# Patient Record
Sex: Female | Born: 1980 | Race: White | Hispanic: No | Marital: Married | Smoking: Never smoker
Health system: Southern US, Community
[De-identification: ages and names within clinical notes are randomized; demographics above are authoritative.]

## PROBLEM LIST (undated history)

## (undated) DIAGNOSIS — Z87442 Personal history of urinary calculi: Secondary | ICD-10-CM

## (undated) DIAGNOSIS — K219 Gastro-esophageal reflux disease without esophagitis: Secondary | ICD-10-CM

## (undated) DIAGNOSIS — J45909 Unspecified asthma, uncomplicated: Secondary | ICD-10-CM

## (undated) DIAGNOSIS — E063 Autoimmune thyroiditis: Secondary | ICD-10-CM

## (undated) HISTORY — DX: Gastro-esophageal reflux disease without esophagitis: K21.9

## (undated) HISTORY — DX: Autoimmune thyroiditis: E06.3

## (undated) HISTORY — PX: UPPER GASTROINTESTINAL ENDOSCOPY: SHX188

## (undated) HISTORY — DX: Unspecified asthma, uncomplicated: J45.909

## (undated) HISTORY — PX: ANKLE SURGERY: SHX546

## (undated) HISTORY — DX: Personal history of urinary calculi: Z87.442

## (undated) HISTORY — PX: KIDNEY STONE SURGERY: SHX686

## (undated) HISTORY — PX: CARPAL TUNNEL WITH CUBITAL TUNNEL: SHX5608

---

## 2017-12-10 LAB — HM PAP SMEAR: HM PAP: NEGATIVE

## 2018-10-30 ENCOUNTER — Ambulatory Visit (INDEPENDENT_AMBULATORY_CARE_PROVIDER_SITE_OTHER): Payer: No Typology Code available for payment source | Admitting: Physician Assistant

## 2018-10-30 ENCOUNTER — Encounter: Payer: Self-pay | Admitting: Physician Assistant

## 2018-10-30 VITALS — BP 121/79 | HR 84 | Resp 16 | Ht 61.0 in | Wt 173.0 lb

## 2018-10-30 DIAGNOSIS — Z87442 Personal history of urinary calculi: Secondary | ICD-10-CM

## 2018-10-30 DIAGNOSIS — J4520 Mild intermittent asthma, uncomplicated: Secondary | ICD-10-CM

## 2018-10-30 DIAGNOSIS — Z13220 Encounter for screening for lipoid disorders: Secondary | ICD-10-CM

## 2018-10-30 DIAGNOSIS — G5602 Carpal tunnel syndrome, left upper limb: Secondary | ICD-10-CM | POA: Insufficient documentation

## 2018-10-30 DIAGNOSIS — Z8349 Family history of other endocrine, nutritional and metabolic diseases: Secondary | ICD-10-CM | POA: Insufficient documentation

## 2018-10-30 DIAGNOSIS — Z7689 Persons encountering health services in other specified circumstances: Secondary | ICD-10-CM | POA: Diagnosis not present

## 2018-10-30 DIAGNOSIS — Z1329 Encounter for screening for other suspected endocrine disorder: Secondary | ICD-10-CM

## 2018-10-30 DIAGNOSIS — J310 Chronic rhinitis: Secondary | ICD-10-CM | POA: Insufficient documentation

## 2018-10-30 DIAGNOSIS — J0110 Acute frontal sinusitis, unspecified: Secondary | ICD-10-CM

## 2018-10-30 DIAGNOSIS — J342 Deviated nasal septum: Secondary | ICD-10-CM | POA: Insufficient documentation

## 2018-10-30 DIAGNOSIS — Z3169 Encounter for other general counseling and advice on procreation: Secondary | ICD-10-CM

## 2018-10-30 DIAGNOSIS — Z130 Encounter for screening for diseases of the blood and blood-forming organs and certain disorders involving the immune mechanism: Secondary | ICD-10-CM

## 2018-10-30 MED ORDER — IPRATROPIUM BROMIDE 0.06 % NA SOLN: 2.0000 | Freq: Four times a day (QID) | NASAL | 5 refills | Status: DC | PRN

## 2018-10-30 MED ORDER — PRENATAL ONE DAILY 27-0.8 MG PO TABS: 1.0000 | ORAL_TABLET | Freq: Every day | ORAL | 3 refills | Status: AC

## 2018-10-30 MED ORDER — AMOXICILLIN-POT CLAVULANATE 875-125 MG PO TABS: 1.0000 | ORAL_TABLET | Freq: Two times a day (BID) | ORAL | 0 refills | Status: AC

## 2018-10-30 NOTE — Progress Notes (Signed)
HPI:                                                                Anita Bryant is a 38 y.o. female who presents to [facility]: [place] today to establish care  Recently relocated from [facility], [place]  Current concerns:  Planning to achieve pregnancy within the next year. Husband has low sperm count and low motility. Recently referred to a new fertility specialist.  Endorses chronic PND, throat clearing/cough, and sinus pressure. States she has had purulent, green nasal drainage for several weeks. No fever, chills, malaise. Hx of deviated nasal septum. She states she was evaluated by ENT and had a CT sinus, but does not know the result. She has tried Flonase without relief. Not currently on any treatment. No recent antibiotic use.  Asthma: reports it is non-allergic. Triggered by perfumes and chemicals mostly. Uses Albuterol prn. No exacerbations in the last year.   Depression screen [facility] 2/9 10/30/2018  Decreased Interest 0  Down, Depressed, Hopeless 0  PHQ - 2 Score 0    No flowsheet data found.    Past Medical History:  Diagnosis Date  . Asthma   . History of nephrolithiasis    Past Surgical History:  Procedure Laterality Date  . CARPAL TUNNEL WITH CUBITAL TUNNEL    . KIDNEY STONE SURGERY     Social History   Tobacco Use  . Smoking status: Never Smoker  . Smokeless tobacco: Never Used  Substance Use Topics  . Alcohol use: Never    Frequency: Never   family history includes Thyroid disease in her mother.    ROS: negative except as noted in the HPI  Medications: Current Outpatient Medications  Medication Sig Dispense Refill  . albuterol (PROVENTIL HFA;VENTOLIN HFA) 108 (90 Base) MCG/ACT inhaler Inhale 1-2 puffs into the lungs every 6 (six) hours as needed.    Marland Kitchen amoxicillin-clavulanate (AUGMENTIN) 875-125 MG tablet Take 1 tablet by mouth 2 (two) times daily for 14 days. 28 tablet 0  . ipratropium (ATROVENT) 0.06 % nasal  spray Place 2 sprays into both nostrils 4 (four) times daily as needed for rhinitis. 15 mL 5  . Prenatal Vit-Fe Fumarate-FA (PRENATAL ONE DAILY) 27-0.8 MG TABS Take 1 tablet by mouth daily. 90 tablet 3   No current facility-administered medications for this visit.    No Known Allergies     Objective:  BP 121/79   Pulse 84   Resp 16   Ht 5\' 1"  (1.549 m)   Wt 173 lb (78.5 kg)   LMP 10/11/2018 (Exact Date)   BMI 32.69 kg/m  Gen:  alert, not ill-appearing, no distress, appropriate for age, obese female HEENT: head normocephalic without obvious abnormality, conjunctiva and cornea clear, wearing glasses, rightward deviation of nasal septum, nasal mucosa edematous, there is frontal sinus tenderness, oropharynx clear, uvula midline, neck supple, no cervical adenopathy,  trachea midline Pulm: Normal work of breathing, normal phonation, clear to auscultation bilaterally, no wheezes, rales or rhonchi CV: Normal rate, regular rhythm, s1 and s2 distinct, no murmurs, clicks or rubs  Neuro: alert and oriented x 3, no tremor MSK: extremities atraumatic, normal gait and station Skin: intact, no rashes on exposed skin, no jaundice, no cyanosis Psych: well-groomed,  cooperative, good eye contact, euthymic mood, affect mood-congruent, speech is articulate, and thought processes clear and goal-directed    No results found for this or any previous visit (from the past 72 hour(s)). No results found.    Assessment and Plan: 38 y.o. female with   .[name] was seen today for establish care.  Diagnoses and all orders for this visit:  Encounter to establish care  History of nephrolithiasis  Deviated nasal septum  Chronic rhinitis -     ipratropium (ATROVENT) 0.06 % nasal spray; Place 2 sprays into both nostrils 4 (four) times daily as needed for rhinitis.  Mild intermittent asthma without complication  Carpal tunnel syndrome on left  Family history of thyroid disease in mother -     Thyroid  Panel With TSH  Screening for blood disease -     COMPLETE METABOLIC PANEL WITH GFR -     CBC  Screening for lipid disorders -     Lipid Panel w/reflex Direct LDL  Screening for thyroid disorder -     Thyroid Panel With TSH  Subacute frontal sinusitis -     amoxicillin-clavulanate (AUGMENTIN) 875-125 MG tablet; Take 1 tablet by mouth 2 (two) times daily for 14 days.  Encounter for preconception consultation -     Prenatal Vit-Fe Fumarate-FA (PRENATAL ONE DAILY) 27-0.8 MG TABS; Take 1 tablet by mouth daily.    - Personally reviewed PMH, PSH, PFH, medications, allergies, HM - Age-appropriate cancer screening: Pap UTD per patient, requesting record from "[facility]" - Influenza declined - Tdap UTD - PHQ2 negative    Patient education and anticipatory guidance given Patient agrees with treatment plan Follow-up as needed if symptoms worsen or fail to improve  Darlyne Russian PA-C

## 2018-11-01 ENCOUNTER — Other Ambulatory Visit: Payer: Self-pay | Admitting: Physician Assistant

## 2018-11-01 DIAGNOSIS — R7989 Other specified abnormal findings of blood chemistry: Secondary | ICD-10-CM

## 2018-11-01 LAB — CBC
HCT: 40.1 % (ref 35.0–45.0)
Hemoglobin: 13.2 g/dL (ref 11.7–15.5)
MCH: 27.5 pg (ref 27.0–33.0)
MCHC: 32.9 g/dL (ref 32.0–36.0)
MCV: 83.5 fL (ref 80.0–100.0)
MPV: 9.7 fL (ref 7.5–12.5)
Platelets: 286 10*3/uL (ref 140–400)
RBC: 4.8 10*6/uL (ref 3.80–5.10)
RDW: 13 % (ref 11.0–15.0)
WBC: 6.9 10*3/uL (ref 3.8–10.8)

## 2018-11-01 LAB — COMPLETE METABOLIC PANEL WITH GFR
AG Ratio: 1.8 (calc) (ref 1.0–2.5)
ALT: 14 U/L (ref 6–29)
AST: 13 U/L (ref 10–30)
Albumin: 4.5 g/dL (ref 3.6–5.1)
Alkaline phosphatase (APISO): 73 U/L (ref 31–125)
BUN: 14 mg/dL (ref 7–25)
CO2: 27 mmol/L (ref 20–32)
Calcium: 9.3 mg/dL (ref 8.6–10.2)
Chloride: 105 mmol/L (ref 98–110)
Creat: 0.8 mg/dL (ref 0.50–1.10)
GFR, Est African American: 110 mL/min/{1.73_m2} (ref >=60)
GFR, Est Non African American: 95 mL/min/{1.73_m2} (ref >=60)
GLUCOSE: 89 mg/dL (ref 65–99)
Globulin: 2.5 g/dL (calc) (ref 1.9–3.7)
Potassium: 4 mmol/L (ref 3.5–5.3)
Sodium: 138 mmol/L (ref 135–146)
Total Bilirubin: 0.5 mg/dL (ref 0.2–1.2)
Total Protein: 7 g/dL (ref 6.1–8.1)

## 2018-11-01 LAB — THYROID PANEL WITH TSH
Free Thyroxine Index: 2.4 (ref 1.4–3.8)
T3 Uptake: 32 % (ref 22–35)
T4, Total: 7.5 ug/dL (ref 5.1–11.9)
TSH: 4.73 mIU/L — ABNORMAL HIGH

## 2018-11-01 LAB — LIPID PANEL W/REFLEX DIRECT LDL
Cholesterol: 183 mg/dL (ref <200)
HDL: 49 mg/dL — AB (ref >=50)
LDL CHOLESTEROL (CALC): 105 mg/dL — AB
Non-HDL Cholesterol (Calc): 134 mg/dL (calc) — ABNORMAL HIGH (ref <130)
Total CHOL/HDL Ratio: 3.7 (calc) (ref <5.0)
Triglycerides: 169 mg/dL — ABNORMAL HIGH (ref <150)

## 2018-11-01 NOTE — Progress Notes (Signed)
Per labs:  "Would recommend we recheck your thyroid function in 3 months"  Ordered and faxed to labs for 3 months

## 2018-11-04 ENCOUNTER — Telehealth: Payer: Self-pay | Admitting: Physician Assistant

## 2018-11-04 NOTE — Telephone Encounter (Signed)
Let patient know I had a chance to review her CT sinus There is no evidence of sinusitis on this scan There is significant swelling of the nasal turbinates suggesting chronic rhinitis. Definitely recommend treating this with an intranasal steroid. There are multiple options, so if she did not tolerate Flonase she could try Nasonex  She can pick-up the disc of her images from our office (will leave in my basket)

## 2018-11-04 NOTE — Telephone Encounter (Signed)
Pt notified of recommendations -EH/RMA  

## 2018-11-07 DIAGNOSIS — J45901 Unspecified asthma with (acute) exacerbation: Secondary | ICD-10-CM | POA: Insufficient documentation

## 2018-11-07 DIAGNOSIS — J4520 Mild intermittent asthma, uncomplicated: Secondary | ICD-10-CM | POA: Insufficient documentation

## 2018-11-14 ENCOUNTER — Encounter: Payer: Self-pay | Admitting: Physician Assistant

## 2018-11-16 ENCOUNTER — Telehealth: Payer: No Typology Code available for payment source | Admitting: Nurse Practitioner

## 2018-11-16 DIAGNOSIS — M545 Low back pain, unspecified: Secondary | ICD-10-CM

## 2018-11-16 MED ORDER — NAPROXEN 500 MG PO TABS: 500.0000 mg | ORAL_TABLET | Freq: Two times a day (BID) | ORAL | 1 refills | Status: DC

## 2018-11-16 MED ORDER — CYCLOBENZAPRINE HCL 10 MG PO TABS: 10.0000 mg | ORAL_TABLET | Freq: Three times a day (TID) | ORAL | 1 refills | Status: DC | PRN

## 2018-11-16 NOTE — Progress Notes (Signed)

## 2018-12-18 ENCOUNTER — Ambulatory Visit: Payer: No Typology Code available for payment source | Admitting: Physician Assistant

## 2018-12-18 ENCOUNTER — Encounter: Payer: Self-pay | Admitting: Physician Assistant

## 2018-12-18 ENCOUNTER — Ambulatory Visit (HOSPITAL_BASED_OUTPATIENT_CLINIC_OR_DEPARTMENT_OTHER)
Admission: RE | Admit: 2018-12-18 | Discharge: 2018-12-18 | Disposition: A | Discharge status: Home / Self Care | Payer: No Typology Code available for payment source | Source: Ambulatory Visit | Attending: Physician Assistant | Admitting: Physician Assistant

## 2018-12-18 ENCOUNTER — Encounter (HOSPITAL_BASED_OUTPATIENT_CLINIC_OR_DEPARTMENT_OTHER): Payer: Self-pay

## 2018-12-18 ENCOUNTER — Other Ambulatory Visit: Payer: Self-pay

## 2018-12-18 ENCOUNTER — Other Ambulatory Visit: Payer: Self-pay | Admitting: Physician Assistant

## 2018-12-18 VITALS — BP 132/86 | HR 83 | Temp 98.8°F | Resp 16 | Wt 172.0 lb

## 2018-12-18 DIAGNOSIS — R1031 Right lower quadrant pain: Secondary | ICD-10-CM

## 2018-12-18 DIAGNOSIS — R1013 Epigastric pain: Secondary | ICD-10-CM | POA: Diagnosis not present

## 2018-12-18 DIAGNOSIS — R1084 Generalized abdominal pain: Secondary | ICD-10-CM | POA: Diagnosis not present

## 2018-12-18 DIAGNOSIS — R110 Nausea: Secondary | ICD-10-CM

## 2018-12-18 DIAGNOSIS — R7989 Other specified abnormal findings of blood chemistry: Secondary | ICD-10-CM

## 2018-12-18 DIAGNOSIS — R1011 Right upper quadrant pain: Secondary | ICD-10-CM

## 2018-12-18 DIAGNOSIS — R198 Other specified symptoms and signs involving the digestive system and abdomen: Secondary | ICD-10-CM

## 2018-12-18 LAB — LIPASE: Lipase: 19 U/L (ref 7–60)

## 2018-12-18 LAB — COMPLETE METABOLIC PANEL WITH GFR
AG RATIO: 1.7 (calc) (ref 1.0–2.5)
ALT: 30 U/L — ABNORMAL HIGH (ref 6–29)
AST: 21 U/L (ref 10–30)
Albumin: 4.5 g/dL (ref 3.6–5.1)
Alkaline phosphatase (APISO): 90 U/L (ref 31–125)
BILIRUBIN TOTAL: 0.3 mg/dL (ref 0.2–1.2)
BUN: 14 mg/dL (ref 7–25)
CHLORIDE: 105 mmol/L (ref 98–110)
CO2: 25 mmol/L (ref 20–32)
Calcium: 9.4 mg/dL (ref 8.6–10.2)
Creat: 0.66 mg/dL (ref 0.50–1.10)
GFR, Est African American: 132 mL/min/{1.73_m2} (ref >=60)
GFR, Est Non African American: 114 mL/min/{1.73_m2} (ref >=60)
GLUCOSE: 106 mg/dL — AB (ref 65–99)
Globulin: 2.7 g/dL (calc) (ref 1.9–3.7)
Potassium: 3.6 mmol/L (ref 3.5–5.3)
Sodium: 139 mmol/L (ref 135–146)
Total Protein: 7.2 g/dL (ref 6.1–8.1)

## 2018-12-18 LAB — CBC WITH DIFFERENTIAL/PLATELET
Absolute Monocytes: 499 cells/uL (ref 200–950)
Basophils Absolute: 47 cells/uL (ref 0–200)
Basophils Relative: 0.6 %
Eosinophils Absolute: 296 cells/uL (ref 15–500)
Eosinophils Relative: 3.8 %
HCT: 36.4 % (ref 35.0–45.0)
Hemoglobin: 12.5 g/dL (ref 11.7–15.5)
Lymphs Abs: 1521 cells/uL (ref 850–3900)
MCH: 28.1 pg (ref 27.0–33.0)
MCHC: 34.3 g/dL (ref 32.0–36.0)
MCV: 81.8 fL (ref 80.0–100.0)
MONOS PCT: 6.4 %
MPV: 9.3 fL (ref 7.5–12.5)
Neutro Abs: 5437 cells/uL (ref 1500–7800)
Neutrophils Relative %: 69.7 %
Platelets: 387 10*3/uL (ref 140–400)
RBC: 4.45 10*6/uL (ref 3.80–5.10)
RDW: 12.8 % (ref 11.0–15.0)
Total Lymphocyte: 19.5 %
WBC: 7.8 10*3/uL (ref 3.8–10.8)

## 2018-12-18 LAB — POCT URINALYSIS DIPSTICK
Bilirubin, UA: NEGATIVE
GLUCOSE UA: NEGATIVE
Ketones, UA: NEGATIVE
Nitrite, UA: NEGATIVE
Protein, UA: NEGATIVE
Spec Grav, UA: 1.02 (ref 1.010–1.025)
Urobilinogen, UA: 0.2 E.U./dL
pH, UA: 6 (ref 5.0–8.0)

## 2018-12-18 LAB — POCT URINE PREGNANCY: Preg Test, Ur: NEGATIVE

## 2018-12-18 MED ORDER — IOHEXOL 300 MG/ML  SOLN
100.0000 mL | Freq: Once | INTRAMUSCULAR | Status: AC | PRN
  Administered 2018-12-18: 100 mL via INTRAVENOUS

## 2018-12-18 MED ORDER — PANTOPRAZOLE SODIUM 40 MG PO TBEC: 40.0000 mg | DELAYED_RELEASE_TABLET | Freq: Every day | ORAL | 0 refills | Status: DC

## 2018-12-18 MED ORDER — ONDANSETRON HCL 4 MG PO TABS
8.0000 mg | ORAL_TABLET | Freq: Once | ORAL | Status: AC
  Administered 2018-12-18: 8 mg via ORAL

## 2018-12-18 NOTE — Telephone Encounter (Signed)
OK for phone/web visit or do you want pt to be evaluated in office?  Please advise

## 2018-12-18 NOTE — Progress Notes (Signed)
HPI:                                                                Anita Bryant is a 38 y.o. female who presents to [place]: Anita Bryant today for abdominal pain  Onset - 2 weeks ago, gradual Location - Epigastric and RUQ pain Pain is waxing and waning, radiates deep into her back Symptoms are worse at night and worse with laying on her right side Associated with belching, nausea, decreased appetite, and flushing Denies melena or hematochezia; reports she had several days of loose stools, last BM this morning and normal No vomiting or regurgitation Has tried TUMS and Pepcid, which only provides temporary relief for less than an hour Denies regular NSAID use  She has a hx of renal stones but denies symptoms of renal colic. Denies hematuria, dysuria, urgency/frequency   Past Medical History:  Diagnosis Date  . Asthma   . History of nephrolithiasis    Past Surgical History:  Procedure Laterality Date  . CARPAL TUNNEL WITH CUBITAL TUNNEL    . KIDNEY STONE SURGERY     Social History   Tobacco Use  . Smoking status: Never Smoker  . Smokeless tobacco: Never Used  Substance Use Topics  . Alcohol use: Never    Frequency: Never   family history includes Thyroid disease in her mother.    ROS: negative except as noted in the HPI  Medications: Current Outpatient Medications  Medication Sig Dispense Refill  . albuterol (PROVENTIL HFA;VENTOLIN HFA) 108 (90 Base) MCG/ACT inhaler Inhale 1-2 puffs into the lungs every 6 (six) hours as needed.    Marland Kitchen ipratropium (ATROVENT) 0.06 % nasal spray Place 2 sprays into both nostrils 4 (four) times daily as needed for rhinitis. 15 mL 5  . pantoprazole (PROTONIX) 40 MG tablet Take 1 tablet (40 mg total) by mouth daily. 30 tablet 0  . Prenatal Vit-Fe Fumarate-FA (PRENATAL ONE DAILY) 27-0.8 MG TABS Take 1 tablet by mouth daily. 90 tablet 3   No current facility-administered medications for this visit.     No Known Allergies   Objective:  BP 132/86   Pulse 83   Temp 98.8 F (37.1 C) (Oral)   Resp 16   Wt 172 lb (78 kg)   LMP 12/05/2018 (Exact Date)   SpO2 99%   BMI 32.50 kg/m  Gen:  alert, not ill-appearing, no distress, appropriate for age 60: head normocephalic without obvious abnormality, conjunctiva and cornea clear, trachea midline Pulm: Normal work of breathing, normal phonation, clear to auscultation bilaterally, no wheezes, rales or rhonchi CV: Normal rate, regular rhythm, s1 and s2 distinct, no murmurs, clicks or rubs  GI: abdomen soft, there is epigastric and RLQ pain with guarding and rebound tenderness Neuro: alert and oriented x 3, no tremor MSK: extremities atraumatic, normal gait and station Skin: intact, no rashes on exposed skin, no jaundice, no cyanosis   Ref Range & Units 4d ago  Color, UA  yellow   Clarity, UA  clear   Glucose, UA Negative Negative   Bilirubin, UA  negative   Ketones, UA  negative   Spec Grav, UA 1.010 - 1.025 1.020   Blood, UA  moderate   pH, UA 5.0 - 8.0  6.0   Protein, UA Negative Negative   Urobilinogen, UA 0.2 or 1.0 E.U./dL 0.2   Nitrite, UA  negative   Leukocytes, UA Negative TraceAbnormal    Appearance    Odor    Resulting Agency  [facility]      Specimen Collected: 12/18/18 15:32  Last Resulted: 12/18/18 15:32         Assessment and Plan: 38 y.o. female with   .Anita Bryant was seen today for abdominal pain.  Diagnoses and all orders for this visit:  Acute epigastric pain -     pantoprazole (PROTONIX) 40 MG tablet; Take 1 tablet (40 mg total) by mouth daily. -     ondansetron (ZOFRAN) tablet 8 mg  Generalized abdominal pain -     CBC with Differential/Platelet -     COMPLETE METABOLIC PANEL WITH GFR -     Lipase -     POCT Urinalysis Dipstick -     Urine Culture -     POCT urine pregnancy -     ondansetron (ZOFRAN) tablet 8 mg  Nausea without vomiting -     Lipase -     ondansetron (ZOFRAN) tablet 8 mg  Right  lower quadrant abdominal pain -     CT ABDOMEN PELVIS W CONTRAST -     POCT Urinalysis Dipstick -     Urine Culture -     POCT urine pregnancy  Abdominal involuntary guarding  Increased thyroid stimulating hormone (TSH) level -     Thyroid Panel With TSH   Afebrile, no tachycardia, no tachypnea, mildly hypertensive, abdomen tender with guarding and rebound of RLQ UA positive for trace leuks and moderate blood, urine cx pending Urine hcg negative CT abdomen/pelvis pending CBC w/diff, CMP, lipase pending If CT is negative, recommend starting Protonix 40 mg and GERD diet  Patient education and anticipatory guidance given Patient agrees with treatment plan Follow-up as needed if symptoms worsen or fail to improve  Darlyne Russian PA-C

## 2018-12-18 NOTE — Patient Instructions (Addendum)
Plan Go downstairs to imaging. Pick-up oral contrast drink. Follow instructions for drinking Go to the lab for blood draw Go home to drink Go to [facility] for CT scan (they will call you with an appointment time) If CT scan is normal, I want you to start Pantoprazole 40 mg daily and follow a GERD diet I will order an ultrasound to assess your gallbladder tomorrow (if needed)   Food Choices for Gastroesophageal Reflux Disease, Adult When you have gastroesophageal reflux disease (GERD), the foods you eat and your eating habits are very important. Choosing the right foods can help ease the discomfort of GERD. Consider working with a diet and nutrition specialist (dietitian) to help you make healthy food choices. What general guidelines should I follow?  Eating plan  Choose healthy foods low in fat, such as fruits, vegetables, whole grains, low-fat dairy products, and lean meat, fish, and poultry.  Eat frequent, small meals instead of three large meals each day. Eat your meals slowly, in a relaxed setting. Avoid bending over or lying down until 2-3 hours after eating.  Limit high-fat foods such as fatty meats or fried foods.  Limit your intake of oils, butter, and shortening to less than 8 teaspoons each day.  Avoid the following: ? Foods that cause symptoms. These may be different for different people. Keep a food diary to keep track of foods that cause symptoms. ? Alcohol. ? Drinking large amounts of liquid with meals. ? Eating meals during the 2-3 hours before bed.  Cook foods using methods other than frying. This may include baking, grilling, or broiling. Lifestyle  Maintain a healthy weight. Ask your health care provider what weight is healthy for you. If you need to lose weight, work with your health care provider to do so safely.  Exercise for at least 30 minutes on 5 or more days each week, or as told by your health care provider.  Avoid wearing clothes that  fit tightly around your waist and chest.  Do not use any products that contain nicotine or tobacco, such as cigarettes and e-cigarettes. If you need help quitting, ask your health care provider.  Sleep with the head of your bed raised. Use a wedge under the mattress or blocks under the bed frame to raise the head of the bed. What foods are not recommended? The items listed may not be a complete list. Talk with your dietitian about what dietary choices are best for you. Grains Pastries or quick breads with added fat. [place] toast. Vegetables Deep fried vegetables. [place] fries. Any vegetables prepared with added fat. Any vegetables that cause symptoms. For some people this may include tomatoes and tomato products, chili peppers, onions and garlic, and horseradish. Fruits Any fruits prepared with added fat. Any fruits that cause symptoms. For some people this may include citrus fruits, such as oranges, grapefruit, pineapple, and lemons. Meats and other protein foods High-fat meats, such as fatty beef or pork, hot dogs, ribs, ham, sausage, salami and bacon. Fried meat or protein, including fried fish and fried chicken. Nuts and nut butters. Dairy Whole milk and chocolate milk. Sour cream. Cream. Ice cream. Cream cheese. Milk shakes. Beverages Coffee and tea, with or without caffeine. Carbonated beverages. Sodas. Energy drinks. Fruit juice made with acidic fruits (such as orange or grapefruit). Tomato juice. Alcoholic drinks. Fats and oils Butter. Margarine. Shortening. Ghee. Sweets and desserts Chocolate and cocoa. Donuts. Seasoning and other foods Pepper. Peppermint and spearmint. Any condiments, herbs, or  seasonings that cause symptoms. For some people, this may include curry, hot sauce, or vinegar-based salad dressings. Summary  When you have gastroesophageal reflux disease (GERD), food and lifestyle choices are very important to help ease the discomfort of GERD.  Eat frequent, small  meals instead of three large meals each day. Eat your meals slowly, in a relaxed setting. Avoid bending over or lying down until 2-3 hours after eating.  Limit high-fat foods such as fatty meat or fried foods. This information is not intended to replace advice given to you by your health care provider. Make sure you discuss any questions you have with your health care provider. Document Released: 09/11/2005 Document Revised: 09/12/2016 Document Reviewed: 09/12/2016 Elsevier Interactive Patient Education  2019 [employer].

## 2018-12-19 ENCOUNTER — Ambulatory Visit (INDEPENDENT_AMBULATORY_CARE_PROVIDER_SITE_OTHER): Payer: No Typology Code available for payment source

## 2018-12-19 DIAGNOSIS — R1011 Right upper quadrant pain: Secondary | ICD-10-CM | POA: Diagnosis not present

## 2018-12-19 LAB — THYROID PANEL WITH TSH
Free Thyroxine Index: 2.6 (ref 1.4–3.8)
T3 Uptake: 29 % (ref 22–35)
T4, Total: 8.8 ug/dL (ref 5.1–11.9)
TSH: 3.6 m[IU]/L

## 2018-12-19 LAB — URINE CULTURE
MICRO NUMBER:: 352632
SPECIMEN QUALITY:: ADEQUATE

## 2018-12-20 ENCOUNTER — Other Ambulatory Visit: Payer: Self-pay | Admitting: Physician Assistant

## 2018-12-20 DIAGNOSIS — K921 Melena: Secondary | ICD-10-CM

## 2018-12-20 DIAGNOSIS — R1013 Epigastric pain: Secondary | ICD-10-CM

## 2018-12-22 ENCOUNTER — Encounter: Payer: Self-pay | Admitting: Physician Assistant

## 2018-12-22 DIAGNOSIS — R7989 Other specified abnormal findings of blood chemistry: Secondary | ICD-10-CM | POA: Insufficient documentation

## 2018-12-24 ENCOUNTER — Other Ambulatory Visit: Payer: Self-pay

## 2018-12-24 ENCOUNTER — Telehealth: Payer: No Typology Code available for payment source | Admitting: Gastroenterology

## 2018-12-24 ENCOUNTER — Encounter: Payer: Self-pay | Admitting: Gastroenterology

## 2018-12-24 DIAGNOSIS — K760 Fatty (change of) liver, not elsewhere classified: Secondary | ICD-10-CM

## 2018-12-24 DIAGNOSIS — K921 Melena: Secondary | ICD-10-CM

## 2018-12-24 DIAGNOSIS — K7689 Other specified diseases of liver: Secondary | ICD-10-CM

## 2018-12-24 DIAGNOSIS — K602 Anal fissure, unspecified: Secondary | ICD-10-CM

## 2018-12-24 DIAGNOSIS — R1013 Epigastric pain: Secondary | ICD-10-CM

## 2018-12-24 MED ORDER — AMBULATORY NON FORMULARY MEDICATION: 1.0000 [in_us] | Freq: Two times a day (BID) | 2 refills | Status: AC

## 2018-12-24 NOTE — Patient Instructions (Addendum)
We have sent the following medications to your pharmacy for you to pick up at your convenience: [address] [place] [address] Phone: [phone]  Medication Name: Nitroglycerin Ointment 0.125%  Place 1 inch rectally as directed.  A pea-sized drop should be placed on the tip of your finger and then gently placed inside the anus. The finger should be inserted 1/3 - 1/2 its length and may be covered with a plastic glove or finger cot. You may use Vaseline to help coat the finger or dilute the ointment. The first few applications should be taken lying down, as mild light-headedness or a brief headache may occur.  It was a pleasure to see you today!  Vito Cirigliano, D.O.

## 2018-12-24 NOTE — Progress Notes (Signed)
Chief Complaint: Abdominal pain, dyschezia  Referring Provider:     Trixie Dredge, PA-C   HPI:    Due to current restrictions/limitations of in-office visits due to the COVID-19 pandemic, this scheduled clinical appointment was converted to a telehealth virtual consultation using WebEx/Zoom.  -Time of medical discussion: 22 minutes -The patient did consent to this virtual visit and is aware of possible charges through their insurance for this visit.  -Names of all parties present: Anita Bryant (patient), Gerrit Heck, DO, [facility] (physician) -Patient location: Home -Physician location: Office  Anita Bryant is a 38 y.o. female referred to [facility] for evaluation of upper abdominal/RUQ pain. Pain started approx 2 weeks ago, waking from sleep. Radiating to mid back. No prior similar sxs. Nausea with pain, but no emesis. Pain will last multiple hours. Can occur intermittently through the day. Not reliably related to PO intake. Tried Pepto, Tums, and Pepcid AC without any improvement. Did report pulled muscle in back prior onset, was taking Naproxen and muscle relaxant for a few weeks prior to symptom onset. No longer taking any NSAIDs.  Otherwise, no fever, chills, weight loss, early satiety.  Separately, she reports intermittent BRBPR x3 weeks. Reports dyschezia described as tearing pain with BM, but no constipation or change in stool consistency/frequency.  No prior similar symptoms.    Saw PCM on 12/18/2018. Started Protonix 40 mg daily and has noted some overall improvement.  CT with left hepatic hypodensities measuring 13 x 11 mm and 6 x 8 mm, likely small cysts or hemangiomata.  Otherwise no duct dilatation, normal GB and pancreas, moderate stool retention but otherwise normal GI tract.  RUQ [place] also with normal GB no duct dilatation.  Fatty liver infiltration noted and 10 x 14 x 13 mm and 9 x 7 x 8 mm cysts noted in left liver, consistent with  CT findings, with no additional masses.  Otherwise, normal lipase, CBC, CMP (ALT 30; normal at 14 earlier this year).  No known personal or FHx of pancreatic or hepatobiliary disease, GI malignancy, or IBD.   Past medical history, past surgical history, social history, family history, medications, and allergies reviewed in the chart and with patient over the WebEx/Zoom.  Past Medical History:  Diagnosis Date  . Asthma   . GERD (gastroesophageal reflux disease)   . History of nephrolithiasis      Past Surgical History:  Procedure Laterality Date  . CARPAL TUNNEL WITH CUBITAL TUNNEL    . KIDNEY STONE SURGERY     Family History  Problem Relation Age of Onset  . Thyroid disease Mother   . Colon cancer Maternal Great-grandfather    Social History   Tobacco Use  . Smoking status: Never Smoker  . Smokeless tobacco: Never Used  Substance Use Topics  . Alcohol use: Never    Frequency: Never  . Drug use: Never   Current Outpatient Medications  Medication Sig Dispense Refill  . albuterol (PROVENTIL HFA;VENTOLIN HFA) 108 (90 Base) MCG/ACT inhaler Inhale 1-2 puffs into the lungs every 6 (six) hours as needed.    . pantoprazole (PROTONIX) 40 MG tablet Take 1 tablet (40 mg total) by mouth daily. 30 tablet 0  . Prenatal Vit-Fe Fumarate-FA (PRENATAL ONE DAILY) 27-0.8 MG TABS Take 1 tablet by mouth daily. 90 tablet 3   No current facility-administered medications for this visit.    No Known Allergies   Review of  Systems: All systems reviewed and negative except where noted in HPI.     Physical Exam:    Physical exam not completed due to the nature of this telehealth communication.  Patient was otherwise alert and oriented and well communicative.   ASSESSMENT AND PLAN;   1) Epigastric pain: Clinical presentation seems most consistent with NSAID induced gastritis/PUD.  Symptoms improving since starting Protonix 40 mg daily.  CT and RUQ [place] otherwise unrevealing from a GI/luminal  standpoint.  Reviewed full DDX and will plan on treatment as below: -Increase Protonix to 40 mg twice daily x4 weeks, then decrease to lowest effective dose or stop completely if symptoms have resolved -Avoid NSAIDs -APAP okay for MSK pain -If symptoms not improving with trial of high-dose PPI, plan for EGD to evaluate for alternate mucosal/luminal etiology when current endoscopic restrictions related to the COVID-19 pandemic are lifted.  2) Anal Fissure: Clinical symptoms seem consistent with anal fissure, treat as below: - Start topical NTG 0.125%, apply a small, pea-sized amount to the affected area BID for 6-8 weeks.  - We discussed the ADR of headache, and if patient does experience, to call me and will change and make pharmacy request to compound topical CCB (topical nifedipine 0.2-0.3% applied 2-4 times daily; unfortunately, the CCB also carries an ADR of headache in 5-12%). Additionally, cautioned to avoid strenuous activity within 30 minutes of application  - Sitz bath with warm water for 10-15 minutes 2-3 times daily; directed to [employer] available online or at [employer]; ensure to dry area afterwards - If no improvement with appropriate trial of therapy, will either change meds or can consider endosocpic evaluation  3) Hematochezia: As above, suspect related to anal fissure and treating as above.  If symptoms not improving despite adequate trial of treatment, plan for in office evaluation and likely colonoscopy to evaluate for more proximal etiology.  4) Hepatic cysts: CT notable for 13 x 11 mm and 6 x 8 mm hypodensities, likely small cysts versus hemangioma.  Otherwise no duct dilatation and normal GB.  Confirmed 2 small cysts on RUQ [place]. -Discussed incidental finding of benign-appearing hepatic cysts on recent imaging studies.  Given size of 1 of the cysts >10 mm, my typical recommendation is for repeat imaging study in approximately 12 months to ensure size stability  and if stable, no repeat imaging/follow-up needed for this. - Repeat ultrasound or MRI liver in approximately 12 months to ensure size stability  5) Fatty liver infiltration: Recent ultrasound notable for fatty liver infiltration.  Otherwise normal liver enzymes earlier this year, with slight increase in ALT to 30 on repeat panel.  Potentially secondary to medication effect.  Briefly reviewed fatty liver etiology and pathogenesis. -Repeat liver enzymes in 6 months - Resume healthy eating habits  - To f/u in the clinic in 3-6 months or sooner prn     [name], DO, FACG  12/24/2018, 9:53 AM   Ottis Stain*

## 2018-12-25 ENCOUNTER — Telehealth: Payer: Self-pay | Admitting: Gastroenterology

## 2018-12-25 NOTE — Telephone Encounter (Signed)
Medication was sent to [facility] again today. Notified the patient.

## 2018-12-25 NOTE — Telephone Encounter (Signed)
Pt called said that when she called the pharmacy they did not have the medication in. I did confirm the pharmacy and it was correct. She would like to know if you can resent the medication for AMBULATORY NON FORMULARY MEDICATION

## 2018-12-26 ENCOUNTER — Other Ambulatory Visit: Payer: Self-pay | Admitting: Physician Assistant

## 2018-12-26 DIAGNOSIS — R1013 Epigastric pain: Secondary | ICD-10-CM

## 2018-12-26 MED ORDER — PANTOPRAZOLE SODIUM 40 MG PO TBEC: 40.0000 mg | DELAYED_RELEASE_TABLET | Freq: Every day | ORAL | 0 refills | Status: DC

## 2018-12-27 ENCOUNTER — Encounter: Payer: Self-pay | Admitting: Physician Assistant

## 2018-12-27 DIAGNOSIS — R1013 Epigastric pain: Secondary | ICD-10-CM

## 2018-12-30 MED ORDER — PANTOPRAZOLE SODIUM 40 MG PO TBEC: 40.0000 mg | DELAYED_RELEASE_TABLET | Freq: Two times a day (BID) | ORAL | 0 refills | Status: DC

## 2019-01-02 ENCOUNTER — Other Ambulatory Visit: Payer: Self-pay | Admitting: Nurse Practitioner

## 2019-01-10 ENCOUNTER — Encounter: Payer: Self-pay | Admitting: Physician Assistant

## 2019-01-10 ENCOUNTER — Telehealth (INDEPENDENT_AMBULATORY_CARE_PROVIDER_SITE_OTHER): Payer: No Typology Code available for payment source | Admitting: Physician Assistant

## 2019-01-10 DIAGNOSIS — J4521 Mild intermittent asthma with (acute) exacerbation: Secondary | ICD-10-CM

## 2019-01-10 DIAGNOSIS — R059 Cough, unspecified: Secondary | ICD-10-CM

## 2019-01-10 DIAGNOSIS — R05 Cough: Secondary | ICD-10-CM

## 2019-01-10 MED ORDER — BUDESONIDE-FORMOTEROL FUMARATE 80-4.5 MCG/ACT IN AERO: 2.0000 | INHALATION_SPRAY | Freq: Two times a day (BID) | RESPIRATORY_TRACT | 0 refills | Status: DC

## 2019-01-10 NOTE — Progress Notes (Signed)
Virtual Visit via Video Note  I connected with Anita Bryant on 01/10/19 at  1:40 PM EDT by a video enabled telemedicine application and verified that I am speaking with the correct person using two identifiers.   I discussed the limitations of evaluation and management by telemedicine and the availability of in person appointments. The patient expressed understanding and agreed to proceed.  History of Present Illness: HPI:                                                                Anita Bryant is a 38 y.o. female with PMH of asthma and GERD  CC: cough  New onset yesterday Cough is dry Associated with fatigue, malaise, myalgia, rhinorrhea, loose stools as well as mild chest tightness and wheezing. No SOB, fever, or chills. Rescue inhaler is helpful. She used it twice yesterday and once this morning No known sick contacts Has been self-isolating at home for the last 3 weeks.   Past Medical History:  Diagnosis Date  . Asthma   . GERD (gastroesophageal reflux disease)   . History of nephrolithiasis    Past Surgical History:  Procedure Laterality Date  . CARPAL TUNNEL WITH CUBITAL TUNNEL    . KIDNEY STONE SURGERY     Social History   Tobacco Use  . Smoking status: Never Smoker  . Smokeless tobacco: Never Used  Substance Use Topics  . Alcohol use: Never    Frequency: Never   family history includes Colon cancer in her maternal great-grandfather; Thyroid disease in her mother.    ROS: negative except as noted in the HPI  Medications: Current Outpatient Medications  Medication Sig Dispense Refill  . albuterol (PROVENTIL HFA;VENTOLIN HFA) 108 (90 Base) MCG/ACT inhaler Inhale 1-2 puffs into the lungs every 6 (six) hours as needed.    . AMBULATORY NON FORMULARY MEDICATION Place 1 inch rectally 2 (two) times daily. Medication Name: Nitroglycerin Ointment 0.125%  Place 1 inch rectally as directed.  A pea-sized drop should be placed on the tip of your finger and then  gently placed inside the anus. The finger should be inserted 1/3 - 1/2 its length and may be covered with a plastic glove or finger cot. You may use Vaseline to help coat the finger or dilute the ointment. The first few applications should be taken lying down, as mild light-headedness or a brief headache may occur. 30 g 2  . Prenatal Vit-Fe Fumarate-FA (PRENATAL ONE DAILY) 27-0.8 MG TABS Take 1 tablet by mouth daily. 90 tablet 3  . pantoprazole (PROTONIX) 40 MG tablet Take 1 tablet (40 mg total) by mouth 2 (two) times daily for 28 days. (Patient not taking: Reported on 01/10/2019) 60 tablet 0   No current facility-administered medications for this visit.    No Known Allergies     Objective:  There were no vitals taken for this visit. Gen:  alert, not ill-appearing, no distress, appropriate for age 18: head normocephalic without obvious abnormality, conjunctiva and cornea clear, trachea midline Pulm: Normal work of breathing, normal phonation, speaking in full sentences, occasional dry-sounding cough Neuro: alert and oriented x 3   Assessment and Plan: 38 y.o. female with   .Anita Bryant was seen today for cough.  Diagnoses and all orders for this  visit:  Mild intermittent asthma with acute exacerbation -     budesonide-formoterol (SYMBICORT) 80-4.5 MCG/ACT inhaler; Inhale 2 puffs into the lungs 2 (two) times daily.  Cough in adult  Patient unable to provide any vital signs today On video, she does not appear ill, respirations are unlabored, she is speaking in full sentences Symptoms are consistent with a mild asthma exacerbation likely triggered by a viral illness given associated malaise, myalgia, rhinorrhea and loose stools Will avoid Prednisone for now since COVID-19 is on the differential Start Symbicort 2 puffs bid for 14 days Close follow-up on Monday ED precautions discussed with patient   Follow Up Instructions:    I discussed the assessment and treatment plan with the  patient. The patient was provided an opportunity to ask questions and all were answered. The patient agreed with the plan and demonstrated an understanding of the instructions.   The patient was advised to call back or seek an in-person evaluation if the symptoms worsen or if the condition fails to improve as anticipated.  I provided 15 minutes of non-face-to-face time during this encounter.   Trixie Dredge, [place]

## 2019-01-10 NOTE — Telephone Encounter (Signed)
Patient scheduled.

## 2019-01-13 ENCOUNTER — Telehealth (INDEPENDENT_AMBULATORY_CARE_PROVIDER_SITE_OTHER): Payer: No Typology Code available for payment source | Admitting: Physician Assistant

## 2019-01-13 ENCOUNTER — Encounter: Payer: Self-pay | Admitting: Physician Assistant

## 2019-01-13 VITALS — HR 64

## 2019-01-13 DIAGNOSIS — J4521 Mild intermittent asthma with (acute) exacerbation: Secondary | ICD-10-CM | POA: Diagnosis not present

## 2019-01-13 DIAGNOSIS — R05 Cough: Secondary | ICD-10-CM | POA: Diagnosis not present

## 2019-01-13 DIAGNOSIS — R059 Cough, unspecified: Secondary | ICD-10-CM

## 2019-01-13 NOTE — Progress Notes (Signed)
Virtual Visit via Video Note  I connected with Anita Bryant on 01/13/19 at  8:50 AM EDT by a video enabled telemedicine application and verified that I am speaking with the correct person using two identifiers.   I discussed the limitations of evaluation and management by telemedicine and the availability of in person appointments. The patient expressed understanding and agreed to proceed.  History of Present Illness: HPI:                                                                Anita Bryant is a 38 y.o. female with asthma and GERD  CC: cough f/u  Onset: 5 days ago Reports symptoms are about the same She states she is still having tightness and heaviness in her chest Cough is worse with activity and described as "barking" Has not had any loose stools since Saturday  Denies fever or SOB She gets mild relief with Symbicort  She has not used her rescue inhaler since starting Symbicort   Past Medical History:  Diagnosis Date  . Asthma   . GERD (gastroesophageal reflux disease)   . History of nephrolithiasis    Past Surgical History:  Procedure Laterality Date  . CARPAL TUNNEL WITH CUBITAL TUNNEL    . KIDNEY STONE SURGERY     Social History   Tobacco Use  . Smoking status: Never Smoker  . Smokeless tobacco: Never Used  Substance Use Topics  . Alcohol use: Never    Frequency: Never   family history includes Colon cancer in her maternal great-grandfather; Thyroid disease in her mother.    Review of Systems  Constitutional: Positive for fatigue. Negative for chills and fever.  Respiratory: Positive for cough, chest tightness and wheezing. Negative for shortness of breath.   Cardiovascular: Negative for chest pain, palpitations and leg swelling.  All other systems reviewed and are negative.    Medications: Current Outpatient Medications  Medication Sig Dispense Refill  . albuterol (PROVENTIL HFA;VENTOLIN HFA) 108 (90 Base) MCG/ACT inhaler Inhale 1-2 puffs  into the lungs every 6 (six) hours as needed.    . AMBULATORY NON FORMULARY MEDICATION Place 1 inch rectally 2 (two) times daily. Medication Name: Nitroglycerin Ointment 0.125%  Place 1 inch rectally as directed.  A pea-sized drop should be placed on the tip of your finger and then gently placed inside the anus. The finger should be inserted 1/3 - 1/2 its length and may be covered with a plastic glove or finger cot. You may use Vaseline to help coat the finger or dilute the ointment. The first few applications should be taken lying down, as mild light-headedness or a brief headache may occur. 30 g 2  . budesonide-formoterol (SYMBICORT) 80-4.5 MCG/ACT inhaler Inhale 2 puffs into the lungs 2 (two) times daily. 1 Inhaler 0  . pantoprazole (PROTONIX) 40 MG tablet Take 1 tablet (40 mg total) by mouth 2 (two) times daily for 28 days. 60 tablet 0  . Prenatal Vit-Fe Fumarate-FA (PRENATAL ONE DAILY) 27-0.8 MG TABS Take 1 tablet by mouth daily. 90 tablet 3   No current facility-administered medications for this visit.    No Known Allergies     Objective:  Pulse 64  Gen:  alert, not ill-appearing, no distress, appropriate for age 43:  head normocephalic without obvious abnormality, conjunctiva and cornea clear, wearing glasses, trachea midline Pulm: Normal work of breathing, normal phonation, speaking in full sentences, occasional bronchitic sounding cough Neuro: alert and oriented x 3   Assessment and Plan: 38 y.o. female with   .Diagnoses and all orders for this visit:  Cough in adult  Mild intermittent asthma with acute exacerbation   Nonproductive cough x 5 days associated with chest tightness, wheezing and malaise Symptoms are unchanged, but not worsening No fever or SOB Partial response to Symbicort Counseled patient to use her Albuterol prn in between doses of Symbicort Continue supportive care Will continue to avoid Prednisone since we cannot r/o COVID-19 infection Follow-up  in 2 days    Follow Up Instructions:    I discussed the assessment and treatment plan with the patient. The patient was provided an opportunity to ask questions and all were answered. The patient agreed with the plan and demonstrated an understanding of the instructions.   The patient was advised to call back or seek an in-person evaluation if the symptoms worsen or if the condition fails to improve as anticipated.  I provided 10 minutes of non-face-to-face time during this encounter.   Trixie Dredge, [place]

## 2019-01-15 ENCOUNTER — Encounter: Payer: Self-pay | Admitting: Physician Assistant

## 2019-02-21 ENCOUNTER — Encounter: Payer: Self-pay | Admitting: Physician Assistant

## 2019-02-21 MED ORDER — ALBUTEROL SULFATE HFA 108 (90 BASE) MCG/ACT IN AERS: 1.0000 | INHALATION_SPRAY | RESPIRATORY_TRACT | 3 refills | Status: DC | PRN

## 2019-06-13 ENCOUNTER — Ambulatory Visit (INDEPENDENT_AMBULATORY_CARE_PROVIDER_SITE_OTHER): Payer: No Typology Code available for payment source | Admitting: Physician Assistant

## 2019-06-13 DIAGNOSIS — Z23 Encounter for immunization: Secondary | ICD-10-CM

## 2019-09-23 ENCOUNTER — Telehealth: Payer: No Typology Code available for payment source | Admitting: Physician Assistant

## 2019-09-23 ENCOUNTER — Encounter: Payer: Self-pay | Admitting: Medical-Surgical

## 2019-09-23 DIAGNOSIS — J329 Chronic sinusitis, unspecified: Secondary | ICD-10-CM | POA: Diagnosis not present

## 2019-09-23 MED ORDER — AMOXICILLIN-POT CLAVULANATE 875-125 MG PO TABS: 1.0000 | ORAL_TABLET | Freq: Two times a day (BID) | ORAL | 0 refills | Status: DC

## 2019-09-23 NOTE — Progress Notes (Signed)

## 2019-09-24 ENCOUNTER — Encounter: Payer: Self-pay | Admitting: Medical-Surgical

## 2019-09-24 ENCOUNTER — Ambulatory Visit (INDEPENDENT_AMBULATORY_CARE_PROVIDER_SITE_OTHER): Payer: No Typology Code available for payment source | Admitting: Medical-Surgical

## 2019-09-24 VITALS — Temp 97.9°F | Wt 168.0 lb

## 2019-09-24 DIAGNOSIS — J4531 Mild persistent asthma with (acute) exacerbation: Secondary | ICD-10-CM

## 2019-09-24 DIAGNOSIS — J45901 Unspecified asthma with (acute) exacerbation: Secondary | ICD-10-CM | POA: Diagnosis not present

## 2019-09-24 DIAGNOSIS — Z20828 Contact with and (suspected) exposure to other viral communicable diseases: Secondary | ICD-10-CM

## 2019-09-24 DIAGNOSIS — J4521 Mild intermittent asthma with (acute) exacerbation: Secondary | ICD-10-CM

## 2019-09-24 DIAGNOSIS — Z20822 Contact with and (suspected) exposure to covid-19: Secondary | ICD-10-CM | POA: Insufficient documentation

## 2019-09-24 MED ORDER — PREDNISONE 50 MG PO TABS: 50.0000 mg | ORAL_TABLET | Freq: Every day | ORAL | 0 refills | Status: DC

## 2019-09-24 MED ORDER — GUAIFENESIN-CODEINE 100-10 MG/5ML PO SYRP: 5.0000 mL | ORAL_SOLUTION | Freq: Three times a day (TID) | ORAL | 0 refills | Status: DC | PRN

## 2019-09-24 NOTE — Patient Instructions (Signed)
Person Under Monitoring Name: Anita Bryant  Location: [id number] [place] [address]   Infection Prevention Recommendations for Individuals Confirmed to have, or Being Evaluated for, 2019 Novel Coronavirus (COVID-19) Infection Who Receive Care at Home  Individuals who are confirmed to have, or are being evaluated for, COVID-19 should follow the prevention steps below until a healthcare provider or local or state health department says they can return to normal activities.  Stay home except to get medical care You should restrict activities outside your home, except for getting medical care. Do not go to work, school, or public areas, and do not use public transportation or taxis.  Call ahead before visiting your doctor Before your medical appointment, call the healthcare provider and tell them that you have, or are being evaluated for, COVID-19 infection. This will help the healthcare provider's office take steps to keep other people from getting infected. Ask your healthcare provider to call the local or state health department.  Monitor your symptoms Seek prompt medical attention if your illness is worsening (e.g., difficulty breathing). Before going to your medical appointment, call the healthcare provider and tell them that you have, or are being evaluated for, COVID-19 infection. Ask your healthcare provider to call the local or state health department.  Wear a facemask You should wear a facemask that covers your nose and mouth when you are in the same room with other people and when you visit a healthcare provider. People who live with or visit you should also wear a facemask while they are in the same room with you.  Separate yourself from other people in your home As much as possible, you should stay in a different room from other people in your home. Also, you should use a separate bathroom, if available.  Avoid sharing household items You should not  share dishes, drinking glasses, cups, eating utensils, towels, bedding, or other items with other people in your home. After using these items, you should wash them thoroughly with soap and water.  Cover your coughs and sneezes Cover your mouth and nose with a tissue when you cough or sneeze, or you can cough or sneeze into your sleeve. Throw used tissues in a lined trash can, and immediately wash your hands with soap and water for at least 20 seconds or use an alcohol-based hand rub.  Wash your [facility] your hands often and thoroughly with soap and water for at least 20 seconds. You can use an alcohol-based hand sanitizer if soap and water are not available and if your hands are not visibly dirty. Avoid touching your eyes, nose, and mouth with unwashed hands.   Prevention Steps for Caregivers and Household Members of Individuals Confirmed to have, or Being Evaluated for, COVID-19 Infection Being Cared for in the Home  If you live with, or provide care at home for, a person confirmed to have, or being evaluated for, COVID-19 infection please follow these guidelines to prevent infection:  Follow healthcare provider's instructions Make sure that you understand and can help the patient follow any healthcare provider instructions for all care.  Provide for the patient's basic needs You should help the patient with basic needs in the home and provide support for getting groceries, prescriptions, and other personal needs.  Monitor the patient's symptoms If they are getting sicker, call his or her medical provider and tell them that the patient has, or is being evaluated for, COVID-19 infection. This will help the healthcare provider's  office take steps to keep other people from getting infected. Ask the healthcare provider to call the local or state health department.  Limit the number of people who have contact with the patient  If possible, have only one caregiver for the  patient.  Other household members should stay in another home or place of residence. If this is not possible, they should stay  in another room, or be separated from the patient as much as possible. Use a separate bathroom, if available.  Restrict visitors who do not have an essential need to be in the home.  Keep older adults, very young children, and other sick people away from the patient Keep older adults, very young children, and those who have compromised immune systems or chronic health conditions away from the patient. This includes people with chronic heart, lung, or kidney conditions, diabetes, and cancer.  Ensure good ventilation Make sure that shared spaces in the home have good air flow, such as from an air conditioner or an opened window, weather permitting.  Wash your hands often  Wash your hands often and thoroughly with soap and water for at least 20 seconds. You can use an alcohol based hand sanitizer if soap and water are not available and if your hands are not visibly dirty.  Avoid touching your eyes, nose, and mouth with unwashed hands.  Use disposable paper towels to dry your hands. If not available, use dedicated cloth towels and replace them when they become wet.  Wear a facemask and gloves  Wear a disposable facemask at all times in the room and gloves when you touch or have contact with the patient's blood, body fluids, and/or secretions or excretions, such as sweat, saliva, sputum, nasal mucus, vomit, urine, or feces.  Ensure the mask fits over your nose and mouth tightly, and do not touch it during use.  Throw out disposable facemasks and gloves after using them. Do not reuse.  Wash your hands immediately after removing your facemask and gloves.  If your personal clothing becomes contaminated, carefully remove clothing and launder. Wash your hands after handling contaminated clothing.  Place all used disposable facemasks, gloves, and other waste in a lined  container before disposing them with other household waste.  Remove gloves and wash your hands immediately after handling these items.  Do not share dishes, glasses, or other household items with the patient  Avoid sharing household items. You should not share dishes, drinking glasses, cups, eating utensils, towels, bedding, or other items with a patient who is confirmed to have, or being evaluated for, COVID-19 infection.  After the person uses these items, you should wash them thoroughly with soap and water.  Wash laundry thoroughly  Immediately remove and wash clothes or bedding that have blood, body fluids, and/or secretions or excretions, such as sweat, saliva, sputum, nasal mucus, vomit, urine, or feces, on them.  Wear gloves when handling laundry from the patient.  Read and follow directions on labels of laundry or clothing items and detergent. In general, wash and dry with the warmest temperatures recommended on the label.  Clean all areas the individual has used often  Clean all touchable surfaces, such as counters, tabletops, doorknobs, bathroom fixtures, toilets, phones, keyboards, tablets, and bedside tables, every day. Also, clean any surfaces that may have blood, body fluids, and/or secretions or excretions on them.  Wear gloves when cleaning surfaces the patient has come in contact with.  Use a diluted bleach solution (e.g., dilute bleach with 1  part bleach and 10 parts water) or a household disinfectant with a label that says EPA-registered for coronaviruses. To make a bleach solution at home, add 1 tablespoon of bleach to 1 quart (4 cups) of water. For a larger supply, add  cup of bleach to 1 gallon (16 cups) of water.  Read labels of cleaning products and follow recommendations provided on product labels. Labels contain instructions for safe and effective use of the cleaning product including precautions you should take when applying the product, such as wearing gloves or  eye protection and making sure you have good ventilation during use of the product.  Remove gloves and wash hands immediately after cleaning.  Monitor yourself for signs and symptoms of illness Caregivers and household members are considered close contacts, should monitor their health, and will be asked to limit movement outside of the home to the extent possible. Follow the monitoring steps for close contacts listed on the symptom monitoring form.   ? If you have additional questions, contact your local health department or call the epidemiologist on call at [phone] (available 24/7). ? This guidance is subject to change. For the most up-to-date guidance from [facility], please refer to their website: YouBlogs.pl  Medications & Home Remedies for Upper Respiratory Illness   Note: the following list assumes no pregnancy, normal liver & kidney function and no other drug interactions. Dr. Sheppard Coil has highlighted medications which are safe for you to use, but these may not be appropriate for everyone. Always ask a pharmacist or qualified medical provider if you have any questions!    Aches/Pains, Fever, Headache OTC Acetaminophen (Tylenol) 500 mg tablets - take max 2 tablets (1000 mg) every 6 hours (4 times per day)  OTC Ibuprofen (Motrin) 200 mg tablets - take max 4 tablets (800 mg) every 6 hours*   Sinus Congestion OTC Nasal Saline if desired to rinse OTC Oxymetolazone (Afrin, others) sparing use due to rebound congestion, NEVER use in kids   Cough & Sore Throat Prescription cough syrups as directed OTC Guaifenesin (Robitussin, Mucinex, others) - expectorant (helps cough up mucus) (Dextromethorphan and Guaifenesin also come in a combination tablet/syrup) OTC Lozenges w/ Benzocaine + Menthol (Cepacol) Honey - as much as you want! Teas which "coat the throat" - look for ingredients Elm Bark, Licorice Root, Marshmallow Root    Other Prescription Oral Steroids to decrease inflammation and improve energy Prescription Antibiotics if these are necessary for bacterial infection - take ALL, even if you're feeling better  OTC Zinc Lozenges within 24 hours of symptoms onset - mixed evidence this shortens the duration of the common cold Don't waste your money on Vitamin C or Echinacea in acute illness - it's already too late!

## 2019-09-24 NOTE — Assessment & Plan Note (Signed)
Current exacerbation related to potential Covid/viral infection.  Start using Symbicort inhaler 2 puffs twice a day. Prednisone 50 mg burst dose x5 days.  Continue utilizing albuterol rescue inhaler as needed.  Guaifenesin-codeine 3 times daily as needed for cough.

## 2019-09-24 NOTE — Progress Notes (Signed)
Virtual Visit via Video Note  I connected with Anita Bryant on 09/24/19 at  2:20 PM EST by a video enabled telemedicine application and verified that I am speaking with the correct person using two identifiers.   I discussed the limitations of evaluation and management by telemedicine and the availability of in person appointments. The patient expressed understanding and agreed to proceed.  Subjective:    CC: COVID exposure  HPI:  Pleasant 38 year old female patient presenting with reports of COVID exposure from her husband who had positive test results come back yesterday. Patient was seen via e-visit yesterday and prescribed a course of Augmentin for sinusitis. + chills, mild shortness of breath, non-productive cough, HA, body aches, fatigue. Denies anosmia and fever. Not sleeping well due to cough and worry. Went today for COVID testing at the [facility]. Has been using Albuterol inhaler as needed and taking Nyquil at night.    Past medical history, Surgical history, Family history not pertinant except as noted below, Social history, Allergies, and medications have been entered into the medical record, reviewed, and corrections made.   Review of Systems: No fevers, chest pain, nausea, vomiting, diarrhea, or abdominal pain.   Objective:    General: Speaking clearly in complete sentences without any shortness of breath.  Alert and oriented x3.  Normal judgment. No apparent acute distress.    Impression and Recommendations:    Close exposure to COVID-19 virus Covid test pending with [place].  Covid home isolation information and list of over-the-counter URI medications provided in patient instructions.  Asthma with acute exacerbation Current exacerbation related to potential Covid/viral infection.  Start using Symbicort inhaler 2 puffs twice a day. Prednisone 50 mg burst dose x5 days.  Continue utilizing albuterol rescue inhaler as  needed.  Guaifenesin-codeine 3 times daily as needed for cough.    I discussed the assessment and treatment plan with the patient. The patient was provided an opportunity to ask questions and all were answered. The patient agreed with the plan and demonstrated an understanding of the instructions.   The patient was advised to call back or seek an in-person evaluation if the symptoms worsen or if the condition fails to improve as anticipated.  Return if symptoms worsen or fail to improve.   25 minutes of non-face-to-face time was provided during this encounter.  Clearnce Sorrel, DNP, APRN, FNP-BC

## 2019-09-24 NOTE — Assessment & Plan Note (Signed)
Covid test pending with [place].  Covid home isolation information and list of over-the-counter URI medications provided in patient instructions.

## 2019-09-28 ENCOUNTER — Encounter: Payer: Self-pay | Admitting: Physician Assistant

## 2019-10-14 ENCOUNTER — Encounter: Payer: Self-pay | Admitting: Medical-Surgical

## 2019-10-14 ENCOUNTER — Telehealth (INDEPENDENT_AMBULATORY_CARE_PROVIDER_SITE_OTHER): Payer: No Typology Code available for payment source | Admitting: Medical-Surgical

## 2019-10-14 DIAGNOSIS — J329 Chronic sinusitis, unspecified: Secondary | ICD-10-CM | POA: Diagnosis not present

## 2019-10-14 DIAGNOSIS — R05 Cough: Secondary | ICD-10-CM | POA: Diagnosis not present

## 2019-10-14 DIAGNOSIS — R059 Cough, unspecified: Secondary | ICD-10-CM

## 2019-10-14 MED ORDER — BENZONATATE 200 MG PO CAPS: 200.0000 mg | ORAL_CAPSULE | Freq: Two times a day (BID) | ORAL | 0 refills | Status: DC | PRN

## 2019-10-14 MED ORDER — FLUTICASONE PROPIONATE 50 MCG/ACT NA SUSP: 2.0000 | Freq: Every day | NASAL | 0 refills | Status: DC

## 2019-10-14 MED ORDER — GUAIFENESIN-CODEINE 100-10 MG/5ML PO SYRP: 5.0000 mL | ORAL_SOLUTION | Freq: Three times a day (TID) | ORAL | 0 refills | Status: DC | PRN

## 2019-10-14 MED ORDER — PREDNISONE 50 MG PO TABS: 50.0000 mg | ORAL_TABLET | Freq: Every day | ORAL | 0 refills | Status: DC

## 2019-10-14 MED ORDER — DOXYCYCLINE HYCLATE 100 MG PO TABS: 100.0000 mg | ORAL_TABLET | Freq: Two times a day (BID) | ORAL | 0 refills | Status: AC

## 2019-10-14 NOTE — Progress Notes (Addendum)
Virtual Visit via Video Note  I connected with Anita Bryant on 10/14/19 at 11:10 AM EST by a video enabled telemedicine application and verified that I am speaking with the correct person using two identifiers.   I discussed the limitations of evaluation and management by telemedicine and the availability of in person appointments. The patient expressed understanding and agreed to proceed.  Subjective:    CC: sinus symptoms not resolved  HPI:  39 year old female presenting with reports of prior sinus symptoms not resolved after treatment with a 10 day course of Augmentin and a 5-day burst of prednisone.  Finished her Augmentin 10 days ago.  Continues to have ear pain, facial pressure, eye pressure, headache (worse when supine), nonproductive cough, runny nose, postnasal drip, and fatigue.  Cough worse at night.  Difficulty sleeping due to headache and cough.  Has been taking over-the-counter Sudafed sinus, acetaminophen, ibuprofen with minimal relief.  Denies fever, chills, shortness of breath, body aches, chest congestion, nausea/vomiting/diarrhea, loss of taste/smell, sore throat.  Husband tested positive for Covid, patient tested negative.   Past medical history, Surgical history, Family history not pertinant except as noted below, Social history, Allergies, and medications have been entered into the medical record, reviewed, and corrections made.   Review of Systems: No fevers, chills, night sweats, weight loss, chest pain, or shortness of breath.   Objective:    General: Speaking clearly in complete sentences without any shortness of breath.  Alert and oriented x3.  Normal judgment. No apparent acute distress.    Impression and Recommendations:    Sinusitis We will treat with 7 days of twice daily doxycycline.  Prednisone burst x5 days.  Start Flonase 2 sprays each nare daily.  Consider saline rinses or saline nasal spray as needed.  If symptoms do not improve or resolve, may need  to consider CT of the sinuses.  Cough Possible false negative COVID testing at the end of December. Questionable etiology of post-viral syndrome. Tessalon Perles twice daily as needed.  Guaifenesin with codeine may be used at night as needed.    Return if symptoms worsen or fail to improve.   I discussed the assessment and treatment plan with the patient. The patient was provided an opportunity to ask questions and all were answered. The patient agreed with the plan and demonstrated an understanding of the instructions.   The patient was advised to call back or seek an in-person evaluation if the symptoms worsen or if the condition fails to improve as anticipated.  35 minutes of non-face-to-face time was provided during this encounter.   Clearnce Sorrel, DNP, APRN, FNP-BC [facility]

## 2019-11-29 ENCOUNTER — Ambulatory Visit: Payer: No Typology Code available for payment source | Attending: Internal Medicine

## 2019-11-29 DIAGNOSIS — Z23 Encounter for immunization: Secondary | ICD-10-CM

## 2019-11-29 NOTE — Progress Notes (Signed)
   Covid-19 Vaccination Clinic  Name:  Anita Bryant    MRN: [id number] DOB: 10/07/1980  11/29/2019  [name] was observed post Covid-19 immunization for 15 minutes without incident. She was provided with Vaccine Information Sheet and instruction to access the V-Safe system.   [name] was instructed to call 911 with any severe reactions post vaccine: Marland Kitchen Difficulty breathing  . Swelling of face and throat  . A fast heartbeat  . A bad rash all over body  . Dizziness and weakness   Immunizations Administered    Name Date Dose VIS Date Route   Pfizer COVID-19 Vaccine 11/29/2019  6:36 PM 0.3 mL 09/05/2019 Intramuscular   Manufacturer: [place]   Lot: [id number]   [place]: [id number]

## 2019-12-20 ENCOUNTER — Ambulatory Visit: Payer: No Typology Code available for payment source | Attending: Internal Medicine

## 2019-12-20 ENCOUNTER — Telehealth: Payer: No Typology Code available for payment source | Admitting: Emergency Medicine

## 2019-12-20 DIAGNOSIS — R0981 Nasal congestion: Secondary | ICD-10-CM | POA: Diagnosis not present

## 2019-12-20 DIAGNOSIS — Z23 Encounter for immunization: Secondary | ICD-10-CM

## 2019-12-20 MED ORDER — IPRATROPIUM BROMIDE 0.03 % NA SOLN: 2.0000 | Freq: Two times a day (BID) | NASAL | 0 refills | Status: DC

## 2019-12-20 MED ORDER — SALINE SPRAY 0.65 % NA SOLN: 1.0000 | NASAL | 0 refills | Status: DC | PRN

## 2019-12-20 NOTE — Progress Notes (Signed)
   Covid-19 Vaccination Clinic  Name:  Anita Bryant    MRN: [id number] DOB: 09/03/1981  12/20/2019  [name] was observed post Covid-19 immunization for 15 minutes without incident. She was provided with Vaccine Information Sheet and instruction to access the V-Safe system.   [name] was instructed to call 911 with any severe reactions post vaccine: Marland Kitchen Difficulty breathing  . Swelling of face and throat  . A fast heartbeat  . A bad rash all over body  . Dizziness and weakness   Immunizations Administered    Name Date Dose VIS Date Route   Pfizer COVID-19 Vaccine 12/20/2019  9:41 AM 0.3 mL 09/05/2019 Intramuscular   Manufacturer: [place]   Lot: [id number]   [place]: [id number]

## 2019-12-20 NOTE — Progress Notes (Signed)
We are sorry that you are not feeling well.  Here is how we plan to help!  Based on what you have shared with me it looks like you may have sinusitis.   Given your recent antibiotic use and recent dental procedure, I'd recommend supportive care for now.  If your run a fever, have facial swelling, or if the nasal discharge changes colors you should be seen in-person.  Otherwise, try the tips below.  Sinusitis is inflammation and infection in the sinus cavities of the head.  Based on your presentation I believe you most likely have Acute Viral Sinusitis.This is an infection most likely caused by a virus. There is not specific treatment for viral sinusitis other than to help you with the symptoms until the infection runs its course.  You may use an oral decongestant such as Mucinex D or if you have glaucoma or high blood pressure use plain Mucinex. Saline nasal spray help and can safely be used as often as needed for congestion, I have prescribed: Ipratropium Bromide nasal spray 0.03% 2 sprays in eah nostril 2-3 times a day  Some authorities believe that zinc sprays or the use of Echinacea may shorten the course of your symptoms.  Sinus infections are not as easily transmitted as other respiratory infection, however we still recommend that you avoid close contact with loved ones, especially the very young and elderly.  Remember to wash your hands thoroughly throughout the day as this is the number one way to prevent the spread of infection!  Home Care:  Only take medications as instructed by your medical team.  Do not take these medications with alcohol.  A steam or ultrasonic humidifier can help congestion.  You can place a towel over your head and breathe in the steam from hot water coming from a faucet.  Avoid close contacts especially the very young and the elderly.  Cover your mouth when you cough or sneeze.  Always remember to wash your hands.  Get Help Right Away If:  You develop  worsening fever or sinus pain.  You develop a severe head ache or visual changes.  Your symptoms persist after you have completed your treatment plan.  Make sure you  Understand these instructions.  Will watch your condition.  Will get help right away if you are not doing well or get worse.  Your e-visit answers were reviewed by a board certified advanced clinical practitioner to complete your personal care plan.  Depending on the condition, your plan could have included both over the counter or prescription medications.  If there is a problem please reply  once you have received a response from your provider.  Your safety is important to [place].  If you have drug allergies check your prescription carefully.    You can use MyChart to ask questions about today's visit, request a non-urgent call back, or ask for a work or school excuse for 24 hours related to this e-Visit. If it has been greater than 24 hours you will need to follow up with your provider, or enter a new e-Visit to address those concerns.  You will get an e-mail in the next two days asking about your experience.  I hope that your e-visit has been valuable and will speed your recovery. Thank you for using e-visits.   Approximately 5 minutes was used in reviewing the patient's chart, questionnaire, prescribing medications, and documentation.

## 2019-12-25 ENCOUNTER — Encounter: Payer: Self-pay | Admitting: Nurse Practitioner

## 2019-12-25 ENCOUNTER — Telehealth (INDEPENDENT_AMBULATORY_CARE_PROVIDER_SITE_OTHER): Payer: No Typology Code available for payment source | Admitting: Nurse Practitioner

## 2019-12-25 ENCOUNTER — Other Ambulatory Visit: Payer: Self-pay

## 2019-12-25 VITALS — Temp 97.2°F

## 2019-12-25 DIAGNOSIS — Z87442 Personal history of urinary calculi: Secondary | ICD-10-CM

## 2019-12-25 DIAGNOSIS — M546 Pain in thoracic spine: Secondary | ICD-10-CM

## 2019-12-25 MED ORDER — OXYCODONE-ACETAMINOPHEN 5-325 MG PO TABS: 1.0000 | ORAL_TABLET | Freq: Four times a day (QID) | ORAL | 0 refills | Status: AC | PRN

## 2019-12-25 MED ORDER — ONDANSETRON HCL 4 MG PO TABS: 4.0000 mg | ORAL_TABLET | Freq: Three times a day (TID) | ORAL | 3 refills | Status: DC | PRN

## 2019-12-25 MED ORDER — TAMSULOSIN HCL 0.4 MG PO CAPS: 0.4000 mg | ORAL_CAPSULE | Freq: Every day | ORAL | 3 refills | Status: DC

## 2019-12-25 MED ORDER — OXYCODONE-ACETAMINOPHEN 5-325 MG PO TABS: 1.0000 | ORAL_TABLET | Freq: Four times a day (QID) | ORAL | 0 refills | Status: DC | PRN

## 2019-12-25 NOTE — Progress Notes (Signed)
Virtual Visit via MyChart Note  I connected with  Anita Bryant on 12/25/19 at  4:10 PM EDT by the video enabled telemedicine application, MyChart, and verified that I am speaking with the correct person using two identifiers.   I introduced myself as a Designer, jewellery with the practice. We discussed the limitations of evaluation and management by telemedicine and the availability of in person appointments. The patient expressed understanding and agreed to proceed.  The patient is: at home I am: in the office  Subjective:    CC: back pain   HPI: Anita Bryant is a 39 y.o. y/o female presenting via [place] today for mid to low back pain radiation through the body to the abdomen for the past 4 days in addition to increased urination, nausea, and diarrhea.  She does report she had a root canal on Thursday, March 17 and was given penicillin at that time.  She is still taking the penicillin and has 5 more days left on the prescription.  She describes the pain as a dull ache with a burning or stabbing sensation and spasms.  The pain is intermittent but has recently been increasing in frequency.  She does also experience chills and redness to her cheeks whenever the pain begins.  She has not taken anything for the pain.  She does not know of anything that makes it worse or better.  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  She reports that this pain is not as intense as the pain was with her last kidney stone but it is similar in nature.  Past medical history, Surgical history, Family history not pertinant except as noted below, Social history, Allergies, and medications have been entered into the medical record, reviewed, and corrections made.   Review of Systems:  Denies bladder pain, bladder spasms, burning with urination, passage of kidney stones, fever.  Objective:     General: Speaking clearly in complete sentences without any shortness of breath.  Alert and oriented x3.  Normal judgment. No apparent acute distress.  Impression and Recommendations:    1. Acute thoracic back pain, unspecified back pain laterality Acute thoracic back pain increasing in frequency.  Symptoms and presentation highly correlate with presence of kidney stone.  Plan to treat prophylactically with Flomax, Zofran, and as needed pain medication for severe pain associated with movement of the stone. Given the patient's history of requiring surgical removal of kidney stone I do have a low threshold for her seeking emergency care.  Did discuss symptoms that would require emergency management over the weekend. Patient instructed to contact the office if her symptoms have worsened or have not improved on Monday.  - tamsulosin (FLOMAX) 0.4 MG CAPS capsule; Take 1 capsule (0.4 mg total) by mouth daily.  Dispense: 30 capsule; Refill: 3 - ondansetron (ZOFRAN) 4 MG tablet; Take 1 tablet (4 mg total) by mouth every 8 (eight) hours as needed for nausea or vomiting.  Dispense: 30 tablet; Refill: 3 - oxyCODONE-acetaminophen (PERCOCET) 5-325 MG tablet; Take 1 tablet by mouth every 6 (six) hours as needed for up to 5 days for severe pain. Use sparingly to avoid tolerance/dependence  Dispense: 20 tablet; Refill: 0  2. History of nephrolithiasis Patient has had a history significant for nephrolithiasis.  Current symptoms and presentation strongly suggest recurrence of kidney stone.  The patient's history does  include requirement of surgical removal of the kidney stone therefore this must be taken into consideration with this experience. The patient is still experiencing symptoms into next week I recommend a CT for evaluation of the presence of a kidney stone.   I discussed the assessment and treatment plan with the patient. The patient was provided an opportunity to ask questions and all were answered.  The patient agreed with the plan and demonstrated an understanding of the instructions.   The patient was advised to call back or seek an in-person evaluation if the symptoms worsen or if the condition fails to improve as anticipated.  I provided 18 minutes of non-face-to-face interaction with this [place] visit.   Orma Render, NP

## 2019-12-30 ENCOUNTER — Ambulatory Visit: Payer: No Typology Code available for payment source

## 2020-03-12 ENCOUNTER — Ambulatory Visit: Payer: No Typology Code available for payment source | Admitting: Podiatry

## 2020-04-05 ENCOUNTER — Telehealth: Payer: No Typology Code available for payment source | Admitting: Nurse Practitioner

## 2020-04-05 DIAGNOSIS — M545 Low back pain, unspecified: Secondary | ICD-10-CM

## 2020-04-05 MED ORDER — NAPROXEN 500 MG PO TABS
500.0000 mg | ORAL_TABLET | Freq: Two times a day (BID) | ORAL | 1 refills | Status: DC
Start: 2020-04-05 — End: 2020-05-10

## 2020-04-05 MED ORDER — CYCLOBENZAPRINE HCL 10 MG PO TABS
10.0000 mg | ORAL_TABLET | Freq: Three times a day (TID) | ORAL | 1 refills | Status: DC | PRN
Start: 2020-04-05 — End: 2020-05-10

## 2020-04-05 NOTE — Progress Notes (Signed)

## 2020-05-03 ENCOUNTER — Telehealth: Payer: Self-pay | Admitting: Medical-Surgical

## 2020-05-03 DIAGNOSIS — E785 Hyperlipidemia, unspecified: Secondary | ICD-10-CM

## 2020-05-03 DIAGNOSIS — Z0000 Encounter for general adult medical examination without abnormal findings: Secondary | ICD-10-CM

## 2020-05-03 DIAGNOSIS — R7989 Other specified abnormal findings of blood chemistry: Secondary | ICD-10-CM

## 2020-05-03 DIAGNOSIS — Z87442 Personal history of urinary calculi: Secondary | ICD-10-CM

## 2020-05-03 NOTE — Telephone Encounter (Signed)
Orders signed.

## 2020-05-03 NOTE — Telephone Encounter (Signed)
Patient advised.

## 2020-05-03 NOTE — Telephone Encounter (Signed)
Labs pended, please review and sign if OK

## 2020-05-03 NOTE — Telephone Encounter (Signed)
Pt called. She has an appointment for Physical on 8/16 and would like Lab Order sent down prior to her appointment.  Thanks.

## 2020-05-07 LAB — COMPLETE METABOLIC PANEL WITH GFR
AG Ratio: 1.6 (calc) (ref 1.0–2.5)
ALT: 24 U/L (ref 6–29)
AST: 21 U/L (ref 10–30)
Albumin: 4.3 g/dL (ref 3.6–5.1)
Alkaline phosphatase (APISO): 84 U/L (ref 31–125)
BUN: 13 mg/dL (ref 7–25)
CO2: 29 mmol/L (ref 20–32)
Calcium: 9.7 mg/dL (ref 8.6–10.2)
Chloride: 103 mmol/L (ref 98–110)
Creat: 0.8 mg/dL (ref 0.50–1.10)
GFR, Est African American: 108 mL/min/{1.73_m2} (ref >=60)
GFR, Est Non African American: 94 mL/min/{1.73_m2} (ref >=60)
Globulin: 2.7 g/dL (calc) (ref 1.9–3.7)
Glucose, Bld: 90 mg/dL (ref 65–99)
Potassium: 3.8 mmol/L (ref 3.5–5.3)
Sodium: 140 mmol/L (ref 135–146)
Total Bilirubin: 0.5 mg/dL (ref 0.2–1.2)
Total Protein: 7 g/dL (ref 6.1–8.1)

## 2020-05-07 LAB — CBC WITH DIFFERENTIAL/PLATELET
Absolute Monocytes: 530 cells/uL (ref 200–950)
Basophils Absolute: 47 cells/uL (ref 0–200)
Basophils Relative: 0.6 %
Eosinophils Absolute: 320 cells/uL (ref 15–500)
Eosinophils Relative: 4.1 %
HCT: 39.4 % (ref 35.0–45.0)
Hemoglobin: 13 g/dL (ref 11.7–15.5)
Lymphs Abs: 1630 cells/uL (ref 850–3900)
MCH: 27.3 pg (ref 27.0–33.0)
MCHC: 33 g/dL (ref 32.0–36.0)
MCV: 82.8 fL (ref 80.0–100.0)
MPV: 9.6 fL (ref 7.5–12.5)
Monocytes Relative: 6.8 %
Neutro Abs: 5273 cells/uL (ref 1500–7800)
Neutrophils Relative %: 67.6 %
Platelets: 359 10*3/uL (ref 140–400)
RBC: 4.76 10*6/uL (ref 3.80–5.10)
RDW: 13.3 % (ref 11.0–15.0)
Total Lymphocyte: 20.9 %
WBC: 7.8 10*3/uL (ref 3.8–10.8)

## 2020-05-07 LAB — LIPID PANEL W/REFLEX DIRECT LDL
Cholesterol: 181 mg/dL (ref <200)
HDL: 42 mg/dL — ABNORMAL LOW (ref >=50)
LDL Cholesterol (Calc): 102 mg/dL (calc) — ABNORMAL HIGH
Non-HDL Cholesterol (Calc): 139 mg/dL (calc) — ABNORMAL HIGH (ref <130)
Total CHOL/HDL Ratio: 4.3 (calc) (ref <5.0)
Triglycerides: 244 mg/dL — ABNORMAL HIGH (ref <150)

## 2020-05-07 LAB — T4, FREE: Free T4: 1.3 ng/dL (ref 0.8–1.8)

## 2020-05-07 LAB — T3: T3, Total: 125 ng/dL (ref 76–181)

## 2020-05-07 LAB — TSH: TSH: 6 mIU/L — ABNORMAL HIGH

## 2020-05-08 ENCOUNTER — Other Ambulatory Visit: Payer: Self-pay | Admitting: Medical-Surgical

## 2020-05-10 ENCOUNTER — Ambulatory Visit (INDEPENDENT_AMBULATORY_CARE_PROVIDER_SITE_OTHER): Payer: No Typology Code available for payment source | Admitting: Medical-Surgical

## 2020-05-10 ENCOUNTER — Encounter: Payer: Self-pay | Admitting: Medical-Surgical

## 2020-05-10 VITALS — BP 126/84 | HR 88 | Temp 98.2°F | Ht 62.5 in | Wt 168.5 lb

## 2020-05-10 DIAGNOSIS — Z809 Family history of malignant neoplasm, unspecified: Secondary | ICD-10-CM | POA: Insufficient documentation

## 2020-05-10 DIAGNOSIS — Z0000 Encounter for general adult medical examination without abnormal findings: Secondary | ICD-10-CM

## 2020-05-10 DIAGNOSIS — E038 Other specified hypothyroidism: Secondary | ICD-10-CM | POA: Insufficient documentation

## 2020-05-10 DIAGNOSIS — J4520 Mild intermittent asthma, uncomplicated: Secondary | ICD-10-CM | POA: Insufficient documentation

## 2020-05-10 DIAGNOSIS — Z1159 Encounter for screening for other viral diseases: Secondary | ICD-10-CM

## 2020-05-10 DIAGNOSIS — L309 Dermatitis, unspecified: Secondary | ICD-10-CM

## 2020-05-10 DIAGNOSIS — Z114 Encounter for screening for human immunodeficiency virus [HIV]: Secondary | ICD-10-CM

## 2020-05-10 DIAGNOSIS — E039 Hypothyroidism, unspecified: Secondary | ICD-10-CM | POA: Diagnosis not present

## 2020-05-10 MED ORDER — ALBUTEROL SULFATE HFA 108 (90 BASE) MCG/ACT IN AERS: 1.0000 | INHALATION_SPRAY | RESPIRATORY_TRACT | 3 refills | Status: DC | PRN

## 2020-05-10 MED ORDER — CLOBETASOL PROPIONATE 0.05 % EX CREA: 1.0000 "application " | TOPICAL_CREAM | Freq: Two times a day (BID) | CUTANEOUS | 2 refills | Status: AC | PRN

## 2020-05-10 NOTE — Patient Instructions (Signed)
Preventive Care 21-39 Years Old, Female Preventive care refers to visits with your health care provider and lifestyle choices that can promote health and wellness. This includes:  A yearly physical exam. This may also be called an annual well check.  Regular dental visits and eye exams.  Immunizations.  Screening for certain conditions.  Healthy lifestyle choices, such as eating a healthy diet, getting regular exercise, not using drugs or products that contain nicotine and tobacco, and limiting alcohol use. What can I expect for my preventive care visit? Physical exam Your health care provider will check your:  Height and weight. This may be used to calculate body mass index (BMI), which tells if you are at a healthy weight.  Heart rate and blood pressure.  Skin for abnormal spots. Counseling Your health care provider may ask you questions about your:  Alcohol, tobacco, and drug use.  Emotional well-being.  Home and relationship well-being.  Sexual activity.  Eating habits.  Work and work environment.  Method of birth control.  Menstrual cycle.  Pregnancy history. What immunizations do I need?  Influenza (flu) vaccine  This is recommended every year. Tetanus, diphtheria, and pertussis (Tdap) vaccine  You may need a Td booster every 10 years. Varicella (chickenpox) vaccine  You may need this if you have not been vaccinated. Human papillomavirus (HPV) vaccine  If recommended by your health care provider, you may need three doses over 6 months. Measles, mumps, and rubella (MMR) vaccine  You may need at least one dose of MMR. You may also need a second dose. Meningococcal conjugate (MenACWY) vaccine  One dose is recommended if you are age 19-21 years and a first-year college student living in a residence hall, or if you have one of several medical conditions. You may also need additional booster doses. Pneumococcal conjugate (PCV13) vaccine  You may need  this if you have certain conditions and were not previously vaccinated. Pneumococcal polysaccharide (PPSV23) vaccine  You may need one or two doses if you smoke cigarettes or if you have certain conditions. Hepatitis A vaccine  You may need this if you have certain conditions or if you travel or work in places where you may be exposed to hepatitis A. Hepatitis B vaccine  You may need this if you have certain conditions or if you travel or work in places where you may be exposed to hepatitis B. Haemophilus influenzae type b (Hib) vaccine  You may need this if you have certain conditions. You may receive vaccines as individual doses or as more than one vaccine together in one shot (combination vaccines). Talk with your health care provider about the risks and benefits of combination vaccines. What tests do I need?  Blood tests  Lipid and cholesterol levels. These may be checked every 5 years starting at age 20.  Hepatitis C test.  Hepatitis B test. Screening  Diabetes screening. This is done by checking your blood sugar (glucose) after you have not eaten for a while (fasting).  Sexually transmitted disease (STD) testing.  BRCA-related cancer screening. This may be done if you have a family history of breast, ovarian, tubal, or peritoneal cancers.  Pelvic exam and Pap test. This may be done every 3 years starting at age 21. Starting at age 30, this may be done every 5 years if you have a Pap test in combination with an HPV test. Talk with your health care provider about your test results, treatment options, and if necessary, the need for more tests.   Follow these instructions at home: Eating and drinking   Eat a diet that includes fresh fruits and vegetables, whole grains, lean protein, and low-fat dairy.  Take vitamin and mineral supplements as recommended by your health care provider.  Do not drink alcohol if: ? Your health care provider tells you not to drink. ? You are  pregnant, may be pregnant, or are planning to become pregnant.  If you drink alcohol: ? Limit how much you have to 0-1 drink a day. ? Be aware of how much alcohol is in your drink. In the [place] one drink equals one 12 oz bottle of beer (355 mL), one 5 oz glass of wine (148 mL), or one 1 oz glass of hard liquor (44 mL). Lifestyle  Take daily care of your teeth and gums.  Stay active. Exercise for at least 30 minutes on 5 or more days each week.  Do not use any products that contain nicotine or tobacco, such as cigarettes, e-cigarettes, and chewing tobacco. If you need help quitting, ask your health care provider.  If you are sexually active, practice safe sex. Use a condom or other form of birth control (contraception) in order to prevent pregnancy and STIs (sexually transmitted infections). If you plan to become pregnant, see your health care provider for a preconception visit. What's next?  Visit your health care provider once a year for a well check visit.  Ask your health care provider how often you should have your eyes and teeth checked.  Stay up to date on all vaccines. This information is not intended to replace advice given to you by your health care provider. Make sure you discuss any questions you have with your health care provider. Document Revised: 05/23/2018 Document Reviewed: 05/23/2018 Elsevier Patient Education  2020 [employer].

## 2020-05-10 NOTE — Progress Notes (Signed)
HPI: Anita Bryant is a 39 y.o. female who  has a past medical history of Asthma, GERD (gastroesophageal reflux disease), and History of nephrolithiasis.  she presents to [facility] today, 05/10/20,  for chief complaint of:  Annual physical exam  Dentist: every 4 months, hx of periodontal disease, no concerns Eye: almost 2 years ago, glasses, occasional contacts Exercise: aerobics 20 minutes 5-6 days per week Diet: lean meats, fresh fruits and veggies; olive oil for cooking; avoids processed foods and junk food  Recently labs showing subclinical hypothyroidism. Reports hair loss, weight concerns, fatigue, and cold intolerance. Would like further evaluation  Women's health: looking for OB/GYN, actively trying to get pregnant but having fertility issues. Last pap smear 2019, normal.  Asthma: inhaler expires next month, needs refills. Uses prn, used twice last weekend but none for prior 3 weeks. Triggered by strong odors and cold weather.  Past medical, surgical, social and family history reviewed:  Patient Active Problem List   Diagnosis Date Noted  . Subclinical hypothyroidism 05/10/2020  . Mild intermittent asthma without complication 16/06/9603  . Family history of cancer in father 05/10/2020  . Close exposure to COVID-19 virus 09/24/2019  . Increased thyroid stimulating hormone (TSH) level 12/22/2018  . Asthma with acute exacerbation 11/07/2018  . Deviated nasal septum 10/30/2018  . Chronic rhinitis 10/30/2018  . Carpal tunnel syndrome on left 10/30/2018  . Family history of thyroid disease in mother 10/30/2018  . History of nephrolithiasis     Past Surgical History:  Procedure Laterality Date  . CARPAL TUNNEL WITH CUBITAL TUNNEL    . KIDNEY STONE SURGERY      Social History   Tobacco Use  . Smoking status: Never Smoker  . Smokeless tobacco: Never Used  Substance Use Topics  . Alcohol use: Never    Family History  Problem  Relation Age of Onset  . Thyroid disease Mother   . Colon cancer Maternal Great-grandfather   . Cancer Father        MDS cancer, Swed's disease     Current medication list and allergy/intolerance information reviewed:    Current Outpatient Medications  Medication Sig Dispense Refill  . acetaminophen (TYLENOL) 325 MG tablet Take 650 mg by mouth every 4 (four) hours as needed.    Marland Kitchen albuterol (VENTOLIN HFA) 108 (90 Base) MCG/ACT inhaler Inhale 1-2 puffs into the lungs every 4 (four) hours as needed for wheezing or shortness of breath. 18 g 3  . Prenatal Vit-Fe Fumarate-FA (PRENATAL ONE DAILY) 27-0.8 MG TABS Take 1 tablet by mouth daily. 90 tablet 3   No current facility-administered medications for this visit.    No Known Allergies    Review of Systems:  Constitutional:  No  fever, no chills, No recent illness, + unintentional weight changes. + significant fatigue.   HEENT: No  headache, no vision change, no hearing change, No sore throat, + sinus pressure  Cardiac: No  chest pain, No  pressure, No palpitations, No  Orthopnea  Respiratory:  No  shortness of breath. No  Cough  Gastrointestinal: No  abdominal pain, No  nausea, No  vomiting,  No  blood in stool, + intermittent diarrhea, No  constipation   Musculoskeletal: No new myalgia/arthralgia  Skin: No  Rash, No other wounds/concerning lesions  Genitourinary: No  incontinence, No  abnormal genital bleeding, No abnormal genital discharge  Hem/Onc: No  easy bruising/bleeding, No  abnormal lymph node  Endocrine: No cold intolerance,  No heat  intolerance. No polyuria/polydipsia/polyphagia   Neurologic: No  weakness, No  dizziness, No  slurred speech/focal weakness/facial droop  Psychiatric: No  concerns with depression, No  concerns with anxiety, No sleep problems, No mood problems  Exam:  BP 126/84   Pulse 88   Temp 98.2 F (36.8 C) (Oral)   Ht 5' 2.5" (1.588 m)   Wt 168 lb 8 oz (76.4 kg)   LMP 04/26/2020   SpO2  100%   BMI 30.33 kg/m   Constitutional: VS see above. General Appearance: alert, well-developed, well-nourished, NAD  Eyes: Normal lids and conjunctive, non-icteric sclera  Ears, Nose, Mouth, Throat: MMM, Normal external inspection ears/nares/mouth/lips/gums. TM normal bilaterally. Pharynx/tonsils no erythema, no exudate. Nasal mucosa normal.   Neck: No masses, trachea midline. No thyroid enlargement. No tenderness/mass appreciated. No lymphadenopathy  Respiratory: Normal respiratory effort. no wheeze, no rhonchi, no rales  Cardiovascular: S1/S2 normal, no murmur, no rub/gallop auscultated. RRR. No lower extremity edema. Pedal pulse II/IV bilaterally DP and PT. No carotid bruit or JVD. No abdominal aortic bruit.  Gastrointestinal: Nontender, no masses. No hepatomegaly, no splenomegaly. No hernia appreciated. Bowel sounds normal. Rectal exam deferred.   Musculoskeletal: Gait normal. No clubbing/cyanosis of digits.   Neurological: Normal balance/coordination. No tremor. No cranial nerve deficit on limited exam. Motor and sensation intact and symmetric. Cerebellar reflexes intact.   Skin: warm, dry, intact. No rash/ulcer. No concerning nevi or subq nodules on limited exam.    Psychiatric: Normal judgment/insight. Normal mood and affect. Oriented x3.    No results found for this or any previous visit (from the past 72 hour(s)).  No results found.   ASSESSMENT/PLAN:   1. Annual physical exam Labs completed and reviewed. Up to date on preventative care.  2. Subclinical hypothyroidism Referring to Endocrinology for further evaluation and workup. TSH elevated with normal T3/free T4 but she is having symptoms. - Ambulatory referral to Endocrinology  3. Mild intermittent asthma without complication Continue PRN Albuterol inhaler. Refills provided.  4. Family history of cancer in father Dad recently passed away from MDS cancer that was not caught until too late. He was 38 years old.  She is interested in possible genetic testing. She will contact her insurance company to see if this is covered and will let me know.  5. Encounter for screening for HIV Discussed screening recommendations. [place] with screening. Will try to add to labs drawn last week but if unable, will do with next labs drawn. - HIV Antibody (routine testing w rflx)  6. Encounter for hepatitis C screening test for low risk patient Discussed screening recommendations. [place] with screening. Will try to add to labs drawn last week but if unable, will do with next labs drawn. - Hepatitis C antibody   Orders Placed This Encounter  Procedures  . Hepatitis C antibody  . HIV Antibody (routine testing w rflx)  . Ambulatory referral to Endocrinology    Meds ordered this encounter  Medications  . albuterol (VENTOLIN HFA) 108 (90 Base) MCG/ACT inhaler    Sig: Inhale 1-2 puffs into the lungs every 4 (four) hours as needed for wheezing or shortness of breath.    Dispense:  18 g    Refill:  3    Order Specific Question:   Supervising Provider    Answer:   Emeterio Reeve [[id number]]    Patient Instructions  Preventive Care 42-48 Years Old, Female Preventive care refers to visits with your health care provider and lifestyle choices that can promote health and  wellness. This includes:  A yearly physical exam. This may also be called an annual well check.  Regular dental visits and eye exams.  Immunizations.  Screening for certain conditions.  Healthy lifestyle choices, such as eating a healthy diet, getting regular exercise, not using drugs or products that contain nicotine and tobacco, and limiting alcohol use. What can I expect for my preventive care visit? Physical exam Your health care provider will check your:  Height and weight. This may be used to calculate body mass index (BMI), which tells if you are at a healthy weight.  Heart rate and blood pressure.  Skin for abnormal spots. Counseling Your  health care provider may ask you questions about your:  Alcohol, tobacco, and drug use.  Emotional well-being.  Home and relationship well-being.  Sexual activity.  Eating habits.  Work and work Statistician.  Method of birth control.  Menstrual cycle.  Pregnancy history. What immunizations do I need?  Influenza (flu) vaccine  This is recommended every year. Tetanus, diphtheria, and pertussis (Tdap) vaccine  You may need a Td booster every 10 years. Varicella (chickenpox) vaccine  You may need this if you have not been vaccinated. Human papillomavirus (HPV) vaccine  If recommended by your health care provider, you may need three doses over 6 months. Measles, mumps, and rubella (MMR) vaccine  You may need at least one dose of MMR. You may also need a second dose. Meningococcal conjugate (MenACWY) vaccine  One dose is recommended if you are age 73-21 years and a first-year college student living in a residence hall, or if you have one of several medical conditions. You may also need additional booster doses. Pneumococcal conjugate (PCV13) vaccine  You may need this if you have certain conditions and were not previously vaccinated. Pneumococcal polysaccharide (PPSV23) vaccine  You may need one or two doses if you smoke cigarettes or if you have certain conditions. Hepatitis A vaccine  You may need this if you have certain conditions or if you travel or work in places where you may be exposed to hepatitis A. Hepatitis B vaccine  You may need this if you have certain conditions or if you travel or work in places where you may be exposed to hepatitis B. Haemophilus influenzae type b (Hib) vaccine  You may need this if you have certain conditions. You may receive vaccines as individual doses or as more than one vaccine together in one shot (combination vaccines). Talk with your health care provider about the risks and benefits of combination vaccines. What tests do I  need?  Blood tests  Lipid and cholesterol levels. These may be checked every 5 years starting at age 23.  Hepatitis C test.  Hepatitis B test. Screening  Diabetes screening. This is done by checking your blood sugar (glucose) after you have not eaten for a while (fasting).  Sexually transmitted disease (STD) testing.  BRCA-related cancer screening. This may be done if you have a family history of breast, ovarian, tubal, or peritoneal cancers.  Pelvic exam and Pap test. This may be done every 3 years starting at age 62. Starting at age 83, this may be done every 5 years if you have a Pap test in combination with an HPV test. Talk with your health care provider about your test results, treatment options, and if necessary, the need for more tests. Follow these instructions at home: Eating and drinking   Eat a diet that includes fresh fruits and vegetables, whole grains, lean protein,  and low-fat dairy.  Take vitamin and mineral supplements as recommended by your health care provider.  Do not drink alcohol if: ? Your health care provider tells you not to drink. ? You are pregnant, may be pregnant, or are planning to become pregnant.  If you drink alcohol: ? Limit how much you have to 0-1 drink a day. ? Be aware of how much alcohol is in your drink. In the [place] one drink equals one 12 oz bottle of beer (355 mL), one 5 oz glass of wine (148 mL), or one 1 oz glass of hard liquor (44 mL). Lifestyle  Take daily care of your teeth and gums.  Stay active. Exercise for at least 30 minutes on 5 or more days each week.  Do not use any products that contain nicotine or tobacco, such as cigarettes, e-cigarettes, and chewing tobacco. If you need help quitting, ask your health care provider.  If you are sexually active, practice safe sex. Use a condom or other form of birth control (contraception) in order to prevent pregnancy and STIs (sexually transmitted infections). If you plan to  become pregnant, see your health care provider for a preconception visit. What's next?  Visit your health care provider once a year for a well check visit.  Ask your health care provider how often you should have your eyes and teeth checked.  Stay up to date on all vaccines. This information is not intended to replace advice given to you by your health care provider. Make sure you discuss any questions you have with your health care provider. Document Revised: 05/23/2018 Document Reviewed: 05/23/2018 Elsevier Patient Education  [employer].   Follow-up plan: Return in about 1 year (around 05/10/2021) for annual physical exam.  Clearnce Sorrel, DNP, APRN, FNP-BC [facility]

## 2020-05-25 ENCOUNTER — Other Ambulatory Visit: Payer: Self-pay

## 2020-05-25 ENCOUNTER — Encounter: Payer: Self-pay | Admitting: Internal Medicine

## 2020-05-25 ENCOUNTER — Ambulatory Visit: Payer: No Typology Code available for payment source | Admitting: Internal Medicine

## 2020-05-25 VITALS — BP 111/74 | HR 76 | Ht 62.5 in | Wt 169.4 lb

## 2020-05-25 DIAGNOSIS — E039 Hypothyroidism, unspecified: Secondary | ICD-10-CM | POA: Diagnosis not present

## 2020-05-25 DIAGNOSIS — E559 Vitamin D deficiency, unspecified: Secondary | ICD-10-CM | POA: Diagnosis not present

## 2020-05-25 DIAGNOSIS — E063 Autoimmune thyroiditis: Secondary | ICD-10-CM

## 2020-05-25 DIAGNOSIS — M25571 Pain in right ankle and joints of right foot: Secondary | ICD-10-CM | POA: Diagnosis not present

## 2020-05-25 DIAGNOSIS — E038 Other specified hypothyroidism: Secondary | ICD-10-CM

## 2020-05-25 NOTE — Patient Instructions (Addendum)
-   We will stay off  thyroid hormone replacement at this time. Will check you in 3 months

## 2020-05-25 NOTE — Progress Notes (Addendum)
Name: Anita Bryant  MRN/ DOB: [id number], 07/21/1981    Age/ Sex: 39 y.o., female    PCP: Samuel Bouche, NP   Reason for Endocrinology Evaluation: Subclinical Hypothyroidism     Date of Initial Endocrinology Evaluation: 05/25/2020     HPI: [name] is a 39 y.o. female with a past medical history of Asthma . The patient presented for initial endocrinology clinic visit on 05/25/2020 for consultative assistance with her subclinical hypothyroidism.   Pt has been noted to have an intermittent elevated in TSH since 10/2018 with a max level of 6.0 uIU/mL in 04/2020 with normal FT4 at 1.3 ng/dL and normal TT3 at 125 ng/dL.    Pt was noted to have elevated TSH since 2011.   Has never been on LT-4 replacement Does not endorse local neck symptoms   Has cold intolerance , hair loss and eye brows as well as fatigue but no depression    She is having a right ankle surgery pending- awaiting on help   Mother with hypothyroidism ( Hashimoto's )      HISTORY:  Past Medical History:  Past Medical History:  Diagnosis Date  . Asthma   . GERD (gastroesophageal reflux disease)   . History of nephrolithiasis     Past Surgical History:  Past Surgical History:  Procedure Laterality Date  . CARPAL TUNNEL WITH CUBITAL TUNNEL    . KIDNEY STONE SURGERY        Social History:  reports that she has never smoked. She has never used smokeless tobacco. She reports that she does not drink alcohol and does not use drugs.  Family History: family history includes Cancer in her father; Colon cancer in her maternal great-grandfather; Thyroid disease in her mother.   HOME MEDICATIONS: Allergies as of 05/25/2020   No Known Allergies     Medication List       Accurate as of May 25, 2020  6:58 AM. If you have any questions, ask your nurse or doctor.        acetaminophen 325 MG tablet Commonly known as: TYLENOL Take 650 mg by mouth every 4 (four) hours as needed.   albuterol 108 (90  Base) MCG/ACT inhaler Commonly known as: VENTOLIN HFA Inhale 1-2 puffs into the lungs every 4 (four) hours as needed for wheezing or shortness of breath.   clobetasol cream 0.05 % Commonly known as: TEMOVATE Apply 1 application topically 2 (two) times daily as needed.   Prenatal One Daily 27-0.8 MG Tabs Take 1 tablet by mouth daily.         REVIEW OF SYSTEMS: A comprehensive ROS was conducted with the patient and is negative except as per HPI and below:  ROS     OBJECTIVE:  VS: BP 111/74 (BP Location: Right Arm, Patient Position: Sitting, Cuff Size: Normal)   Pulse 76   Ht 5' 2.5" (1.588 m)   Wt 169 lb 6.4 oz (76.8 kg)   LMP 04/26/2020   SpO2 99%   BMI 30.49 kg/m   Wt Readings from Last 3 Encounters:  05/10/20 168 lb 8 oz (76.4 kg)  09/24/19 168 lb (76.2 kg)  12/18/18 172 lb (78 kg)     EXAM: General: Pt appears well and is in NAD  Neck: General: Supple without adenopathy. Thyroid: Thyroid size normal.  No goiter or nodules appreciated. No thyroid bruit.  Lungs: Clear with good BS bilat with no rales, rhonchi, or wheezes  Heart: Auscultation: RRR.  Abdomen:  Normoactive bowel sounds, soft, nontender, without masses or organomegaly palpable  Extremities:  BL LE: No pretibial edema normal ROM and strength.  Skin: Hair: Texture and amount normal with gender appropriate distribution Skin Inspection: No rashes Skin Palpation: Skin temperature, texture, and thickness normal to palpation  Neuro: Cranial nerves: II - XII grossly intact  Motor: Normal strength throughout DTRs: 2+ and symmetric in UE without delay in relaxation phase  Mental Status: Judgment, insight: Intact Memory: Intact for recent and remote events Mood and affect: No depression, anxiety, or agitation     DATA REVIEWED: Results for ANA, WOODROOF (MRN [id number]) as of 05/26/2020 11:22  Ref. Range 05/25/2020 10:57  Thyroperoxidase Ab SerPl-aCnc Latest Ref Range: <9 IU/mL 20 (H)    Results for  ALAZNE, QUANT (MRN [id number]) as of 05/25/2020 10:27  Ref. Range 05/05/2020 07:35  TSH Latest Units: mIU/L 6.00 (H)  Triiodothyronine (T3) Latest Ref Range: 76 - 181 ng/dL 125  T4,Free(Direct) Latest Ref Range: 0.8 - 1.8 ng/dL 1.3   Results for DAMIKA, HARMON (MRN [id number]) as of 05/26/2020 09:06  Ref. Range 05/25/2020 10:57  Vitamin D, 25-Hydroxy Latest Ref Range: 30 - 100 ng/mL 19 (L)     Anti-TPO Ab's pending  ASSESSMENT/PLAN/RECOMMENDATIONS:   1. Subclinical Hypothyroidism/ Hashimoto's Disease  - Pt with multiple non-specific symptoms  - No local neck symptoms  - We discussed guidelines of initiating LT-4 replacement with a TSH at or higher then 10 uIU/mL. I explained to her that we could start her on Levothyroxine but that I could not guarantee that she would feel any better without trying as thyroid symptoms are non-specific and could be caused by other reasons.  - She has opted to withhold any levothyroxine at this time and will recheck in 3 months   2. Ankle pains:   - She is supposed to have surgery on her right ankle , but will need to be immobile for 2 weeks and has no help. This has been postponed due to this and now her left foot is hurting  - I have asked her to talk to the surgeon to see if they can have home care services for her.  - Will check Vitamin D    3. Vitamin D Deficiency:  - Will replenish as below, pt will take this for 3 months but after that will need to switch to OTC D3 1000 iu daily.  - We discussed the importance of vitamin D in musculoskeletal health.    Ergocalciferol 50,000 iu weekly    F/U in 3 months   Signed electronically by: Mack Guise, MD  [facility] Endocrin[facility]  [address], [place] [address] Phone: [phone] FAX: [phone]   CC: Samuel Bouche, [address] [place] [address] Phone: [phone] Fax: [phone]   Return to  Endocrinology clinic as below: Future Appointments  Date Time Provider [place]  05/25/2020 10:30 AM Jeree Delcid, Melanie Crazier, MD [facility]

## 2020-05-26 DIAGNOSIS — E559 Vitamin D deficiency, unspecified: Secondary | ICD-10-CM | POA: Insufficient documentation

## 2020-05-26 DIAGNOSIS — E063 Autoimmune thyroiditis: Secondary | ICD-10-CM | POA: Insufficient documentation

## 2020-05-26 LAB — VITAMIN D 25 HYDROXY (VIT D DEFICIENCY, FRACTURES): Vit D, 25-Hydroxy: 19 ng/mL — ABNORMAL LOW (ref 30–100)

## 2020-05-26 LAB — THYROID PEROXIDASE ANTIBODY: Thyroperoxidase Ab SerPl-aCnc: 20 IU/mL — ABNORMAL HIGH (ref <9)

## 2020-05-26 MED ORDER — ERGOCALCIFEROL 1.25 MG (50000 UT) PO CAPS: 50000.0000 [IU] | ORAL_CAPSULE | ORAL | 0 refills | Status: DC

## 2020-05-29 ENCOUNTER — Encounter: Payer: Self-pay | Admitting: Internal Medicine

## 2020-06-14 ENCOUNTER — Telehealth: Payer: No Typology Code available for payment source | Admitting: Family

## 2020-06-14 DIAGNOSIS — J019 Acute sinusitis, unspecified: Secondary | ICD-10-CM | POA: Diagnosis not present

## 2020-06-14 MED ORDER — AMOXICILLIN-POT CLAVULANATE 875-125 MG PO TABS
1.0000 | ORAL_TABLET | Freq: Two times a day (BID) | ORAL | 0 refills | Status: DC
Start: 2020-06-14 — End: 2020-07-05

## 2020-06-14 NOTE — Progress Notes (Signed)

## 2020-07-05 ENCOUNTER — Ambulatory Visit (INDEPENDENT_AMBULATORY_CARE_PROVIDER_SITE_OTHER): Payer: No Typology Code available for payment source | Admitting: Medical-Surgical

## 2020-07-05 VITALS — BP 107/71 | HR 65

## 2020-07-05 DIAGNOSIS — Z23 Encounter for immunization: Secondary | ICD-10-CM | POA: Diagnosis not present

## 2020-07-05 NOTE — Progress Notes (Signed)
Patient is here for a flu vaccine. Verified no previous allergy to flu vaccine, eggs, or latex. Flu injection to right deltoid with no apparent complications. Patient advised to call with any problems. Copy of immunization given to patient.

## 2020-07-09 ENCOUNTER — Encounter: Payer: Self-pay | Admitting: Medical-Surgical

## 2020-07-09 ENCOUNTER — Telehealth (INDEPENDENT_AMBULATORY_CARE_PROVIDER_SITE_OTHER): Payer: No Typology Code available for payment source | Admitting: Medical-Surgical

## 2020-07-09 DIAGNOSIS — J069 Acute upper respiratory infection, unspecified: Secondary | ICD-10-CM

## 2020-07-09 MED ORDER — HYDROCOD POLST-CPM POLST ER 10-8 MG/5ML PO SUER
5.0000 mL | Freq: Two times a day (BID) | ORAL | 0 refills | Status: DC | PRN
Start: 2020-07-09 — End: 2020-08-24

## 2020-07-09 MED ORDER — PSEUDOEPHEDRINE-GUAIFENESIN ER 60-600 MG PO TB12: 1.0000 | ORAL_TABLET | Freq: Two times a day (BID) | ORAL | 0 refills | Status: DC

## 2020-07-09 MED ORDER — HYDROCODONE-HOMATROPINE 5-1.5 MG/5ML PO SYRP: 5.0000 mL | ORAL_SOLUTION | Freq: Three times a day (TID) | ORAL | 0 refills | Status: DC | PRN

## 2020-07-09 MED ORDER — AZITHROMYCIN 250 MG PO TABS: ORAL_TABLET | ORAL | 0 refills | Status: DC

## 2020-07-09 NOTE — Progress Notes (Signed)
Virtual Visit via Video Note  I connected with Anita Bryant on 07/09/20 at  1:00 PM EDT by a video enabled telemedicine application and verified that I am speaking with the correct person using two identifiers.   I discussed the limitations of evaluation and management by telemedicine and the availability of in person appointments. The patient expressed understanding and agreed to proceed.  Patient location: home Provider locations: office  Subjective:    CC: Possible sinus infection  HPI: Pleasant 39 year old female presenting via [place] video visit for 3 days of upper respiratory symptoms including dry cough, headache, sinus congestion, facial pain/pressure, eye pressure, ear pain, rhinorrhea, postnasal drip, and green nasal discharge.  She was seen for an ED visit on 9/20 and prescribed 7 days of antibiotics for an acute bacterial sinusitis.  She completed her prescription on 9/30 with good resolution of her symptoms.  On Tuesday of this week, she woke up with significant headache along with a return of all of her symptoms.  She is dizzy with bending over due to the pressure in her head and is not sleeping well due to coughing at night.  She has not been taking any medications over-the-counter.  Denies fever, chills, shortness of breath, GI symptoms.  She has had both Pfizer Covid vaccines.  Has not tested for Covid since her symptoms started 3 days ago.  Past medical history, Surgical history, Family history not pertinant except as noted below, Social history, Allergies, and medications have been entered into the medical record, reviewed, and corrections made.   Review of Systems: See HPI for pertinent and negatives.   Objective:    General: Speaking clearly in complete sentences without any shortness of breath.  Alert and oriented x3.  Normal judgment. No apparent acute distress.  Impression and Recommendations:    1. Upper respiratory tract infection, unspecified type Recommend  Covid testing.  Sending in Hycodan cough syrup to facilitate sleep.  Recommend trialing Mucinex-D to help with head pressure.  Okay to use Flonase and/or nasal saline spray for congestion.  Discussed the likely viral nature of her current illness despite it close proximity to her recent sinus infection.  Low suspicion for recurrent sinus infection as her symptoms did resolve after antibiotic treatment.  As we are going into the weekend, I will go ahead and send a azithromycin in to be started on Sunday if her Covid test is negative and her symptoms have not improved with use of Mucinex and cough suppressants.  Patient verbalized understanding and is agreeable to the plan.  Return if symptoms worsen or fail to improve.  20 minutes of non face-to-face time was provided during this encounter.  I discussed the assessment and treatment plan with the patient. The patient was provided an opportunity to ask questions and all were answered. The patient agreed with the plan and demonstrated an understanding of the instructions.   The patient was advised to call back or seek an in-person evaluation if the symptoms worsen or if the condition fails to improve as anticipated.  Clearnce Sorrel, DNP, APRN, FNP-BC [facility]

## 2020-07-09 NOTE — Progress Notes (Signed)
Rx called in verbally to the pharmacy.

## 2020-07-09 NOTE — Addendum Note (Signed)
Addended bySamuel Bouche on: 07/09/2020 04:17 PM   Modules accepted: Orders

## 2020-08-05 DIAGNOSIS — Z9889 Other specified postprocedural states: Secondary | ICD-10-CM | POA: Insufficient documentation

## 2020-08-23 NOTE — Progress Notes (Signed)
Name: Anita Bryant  MRN/ DOB: [id number], 1981/05/29    Age/ Sex: 39 y.o., female     PCP: Samuel Bouche, NP   Reason for Endocrinology Evaluation: Subclinical hypothyroidism     Initial Endocrinology Clinic Visit: 05/25/2020    PATIENT IDENTIFIER: Anita Bryant is a 39 y.o., female with a past medical history of Asthma. She has followed with [facility] since 05/25/2020 for consultative assistance with management of her subclinical hypothyroidism  HISTORICAL SUMMARY:   Pt has been noted to have an intermittent elevated in TSH since 2011 with a max level of 6.0 uIU/mL in 04/2020 with normal FT4 at 1.3 ng/dL and normal TT3 at 125 ng/dL.  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    She had a right ankle surgery 08/05/2020, wearing a boot      Of note, she was noted to have a low Vitamin D at 19 ng/mL in 04/2020 she was on Ergocalciferol 50,000 iu weekly but she stopped it before her sx   She has noted weight gain, and worsening fatigue.     HISTORY:  Past Medical History:  Past Medical History:  Diagnosis Date  . Asthma   . GERD (gastroesophageal reflux disease)   . History of nephrolithiasis    Past Surgical History:  Past Surgical History:  Procedure Laterality Date  . CARPAL TUNNEL WITH CUBITAL TUNNEL    . KIDNEY STONE SURGERY      Social History:  reports that she has never smoked. She has never used smokeless tobacco. She reports that she does not drink alcohol and does not use drugs. Family History:  Family History  Problem Relation Age of Onset  . Thyroid disease Mother   . Colon cancer Maternal Great-grandfather   . Cancer Father        MDS cancer, Swed's disease     HOME MEDICATIONS: Allergies as of 08/24/2020   No Known Allergies     Medication List        Accurate as of August 24, 2020  8:44 AM. If you have any questions, ask your nurse or doctor.        STOP taking these medications   azithromycin 250 MG tablet Commonly known as: ZITHROMAX Stopped by: Dorita Sciara, MD   chlorpheniramine-HYDROcodone 10-8 MG/5ML Suer Commonly known as: TUSSIONEX Stopped by: Dorita Sciara, MD   ergocalciferol 1.25 MG (50000 UT) capsule Commonly known as: VITAMIN D2 Stopped by: Dorita Sciara, MD   pseudoephedrine-guaifenesin 60-600 MG 12 hr tablet Commonly known as: Mucinex D Stopped by: Dorita Sciara, MD     TAKE these medications   acetaminophen 325 MG tablet Commonly known as: TYLENOL Take 650 mg by mouth every 4 (four) hours as needed.   albuterol 108 (90 Base) MCG/ACT inhaler Commonly known as: VENTOLIN HFA Inhale 1-2 puffs into the lungs every 4 (four) hours as needed for wheezing or shortness of breath.   clobetasol cream 0.05 % Commonly known as: TEMOVATE Apply 1 application topically 2 (two) times daily as needed.   Prenatal One Daily 27-0.8 MG Tabs Take 1 tablet by mouth daily.         OBJECTIVE:   PHYSICAL EXAM: VS: BP 120/72   Pulse 82   Ht 5' 2.5" (1.588 m)  Wt 176 lb 8 oz (80.1 kg)   LMP 08/17/2020   SpO2 98%   BMI 31.77 kg/m    EXAM: General: Pt appears well and is in NAD  Neck: General: Supple without adenopathy. Thyroid: Thyroid size normal.  No goiter or nodules appreciated.  Lungs: Clear with good BS bilat with no rales, rhonchi, or wheezes  Heart: Auscultation: RRR.  Abdomen: Normoactive bowel sounds, soft, nontender, without masses or organomegaly palpable  Extremities:  BL LE: No pretibial edema normal ROM and strength.  Mental Status: Judgment, insight: Intact Orientation: Oriented to time, place, and person Mood and affect: No depression, anxiety, or agitation     DATA REVIEWED: Results for Anita Bryant, Anita Bryant (MRN [id number]) as of 08/24/2020 14:46  Ref. Range  08/24/2020 08:45  VITD Latest Ref Range: 30.00 - 100.00 ng/mL 20.72 (L)  Results for Anita Bryant, Anita Bryant (MRN [id number]) as of 08/24/2020 14:46  Ref. Range 08/24/2020 08:45  TSH Latest Ref Range: 0.35 - 4.50 uIU/mL 4.84 (H)  T4,Free(Direct) Latest Ref Range: 0.60 - 1.60 ng/dL 0.79    Results for Anita Bryant, Anita Bryant (MRN [id number]) as of 08/24/2020 08:30  Ref. Range 05/25/2020 10:57  Thyroperoxidase Ab SerPl-aCnc Latest Ref Range: <9 IU/mL 20 (H)    ASSESSMENT / PLAN / RECOMMENDATIONS:    1.  Hashimoto's Thyroiditis   - Pt with multiple non-specific symptoms  - No local neck symptoms  - Repeat TSH showed improvement. Will continue to monitor      2. Vitamin D Deficiency:  -She was prescribed Ergocalciferol back in August, she tells me she stopped taking before the surgery and did not go back on it, Her Vitamin D level today does not show much change, she will be advised to start OTC Vitamin D3.     Vitamin D3 2000 iu daily      F/U in 4 months   Signed electronically by: Mack Guise, MD  [facility]  [address],  [address] Phone: [phone] FAX: [phone]      CC: Samuel Bouche, [address] [place] [address] Phone: [phone]  Fax: [phone]   Return to Endocrinology clinic as below: Future Appointments  Date Time Provider [place]  08/24/2020  8:50 AM Meldrick Buttery, Melanie Crazier, MD [facility] [place]

## 2020-08-24 ENCOUNTER — Ambulatory Visit: Payer: No Typology Code available for payment source | Admitting: Internal Medicine

## 2020-08-24 ENCOUNTER — Encounter: Payer: Self-pay | Admitting: Internal Medicine

## 2020-08-24 ENCOUNTER — Other Ambulatory Visit: Payer: Self-pay

## 2020-08-24 VITALS — BP 120/72 | HR 82 | Ht 62.5 in | Wt 176.5 lb

## 2020-08-24 DIAGNOSIS — E559 Vitamin D deficiency, unspecified: Secondary | ICD-10-CM | POA: Diagnosis not present

## 2020-08-24 DIAGNOSIS — E063 Autoimmune thyroiditis: Secondary | ICD-10-CM | POA: Diagnosis not present

## 2020-08-24 LAB — TSH: TSH: 4.84 u[IU]/mL — ABNORMAL HIGH (ref 0.35–4.50)

## 2020-08-24 LAB — VITAMIN D 25 HYDROXY (VIT D DEFICIENCY, FRACTURES): VITD: 20.72 ng/mL — ABNORMAL LOW (ref 30.00–100.00)

## 2020-08-24 LAB — T4, FREE: Free T4: 0.79 ng/dL (ref 0.60–1.60)

## 2020-08-24 NOTE — Patient Instructions (Signed)
-   Please stop by the labs today

## 2020-09-20 ENCOUNTER — Encounter: Payer: Self-pay | Admitting: Medical-Surgical

## 2020-09-20 ENCOUNTER — Telehealth: Payer: No Typology Code available for payment source | Admitting: Nurse Practitioner

## 2020-09-20 DIAGNOSIS — J4521 Mild intermittent asthma with (acute) exacerbation: Secondary | ICD-10-CM | POA: Diagnosis not present

## 2020-09-20 MED ORDER — DOXYCYCLINE HYCLATE 100 MG PO TABS: 100.0000 mg | ORAL_TABLET | Freq: Two times a day (BID) | ORAL | 0 refills | Status: DC

## 2020-09-20 NOTE — Progress Notes (Signed)
We are sorry that you are not feeling well.  Here is how we plan to help!  Based on your presentation I believe you most likely have A cough due to bacteria.  When patients have a fever and a productive cough with a change in color or increased sputum production, we are concerned about bacterial bronchitis.  If left untreated it can progress to pneumonia.  If your symptoms do not improve with your treatment plan it is important that you contact your provider.   I have prescribed Doxycycline 100 mg twice a day for 7 days     In addition you may use A non-prescription cough medication called Mucinex DM: take 2 tablets every 12 hours.   From your responses in the eVisit questionnaire you describe inflammation in the upper respiratory tract which is causing a significant cough.  This is commonly called Bronchitis and has four common causes:    Allergies  Viral Infections  Acid Reflux  Bacterial Infection Allergies, viruses and acid reflux are treated by controlling symptoms or eliminating the cause. An example might be a cough caused by taking certain blood pressure medications. You stop the cough by changing the medication. Another example might be a cough caused by acid reflux. Controlling the reflux helps control the cough.  USE OF BRONCHODILATOR ("RESCUE") INHALERS: There is a risk from using your bronchodilator too frequently.  The risk is that over-reliance on a medication which only relaxes the muscles surrounding the breathing tubes can reduce the effectiveness of medications prescribed to reduce swelling and congestion of the tubes themselves.  Although you feel brief relief from the bronchodilator inhaler, your asthma may actually be worsening with the tubes becoming more swollen and filled with mucus.  This can delay other crucial treatments, such as oral steroid medications. If you need to use a bronchodilator inhaler daily, several times per day, you should discuss this with your  provider.  There are probably better treatments that could be used to keep your asthma under control.     HOME CARE . Only take medications as instructed by your medical team. . Complete the entire course of an antibiotic. . Drink plenty of fluids and get plenty of rest. . Avoid close contacts especially the very young and the elderly . Cover your mouth if you cough or cough into your sleeve. . Always remember to wash your hands . A steam or ultrasonic humidifier can help congestion.   GET HELP RIGHT AWAY IF: . You develop worsening fever. . You become short of breath . You cough up blood. . Your symptoms persist after you have completed your treatment plan MAKE SURE YOU   Understand these instructions.  Will watch your condition.  Will get help right away if you are not doing well or get worse.  Your e-visit answers were reviewed by a board certified advanced clinical practitioner to complete your personal care plan.  Depending on the condition, your plan could have included both over the counter or prescription medications. If there is a problem please reply  once you have received a response from your provider. Your safety is important to us.  If you have drug allergies check your prescription carefully.    You can use MyChart to ask questions about today's visit, request a non-urgent call back, or ask for a work or school excuse for 24 hours related to this e-Visit. If it has been greater than 24 hours you will need to follow up with your provider,   or enter a new e-Visit to address those concerns. You will get an e-mail in the next two days asking about your experience.  I hope that your e-visit has been valuable and will speed your recovery. Thank you for using e-visits.  5-10 minutes spent reviewing and documenting in chart.   

## 2020-11-02 ENCOUNTER — Encounter: Payer: Self-pay | Admitting: Medical-Surgical

## 2020-11-04 ENCOUNTER — Telehealth (INDEPENDENT_AMBULATORY_CARE_PROVIDER_SITE_OTHER): Payer: No Typology Code available for payment source | Admitting: Medical-Surgical

## 2020-11-04 ENCOUNTER — Encounter: Payer: Self-pay | Admitting: Medical-Surgical

## 2020-11-04 VITALS — Temp 97.3°F

## 2020-11-04 DIAGNOSIS — J40 Bronchitis, not specified as acute or chronic: Secondary | ICD-10-CM

## 2020-11-04 DIAGNOSIS — J329 Chronic sinusitis, unspecified: Secondary | ICD-10-CM

## 2020-11-04 MED ORDER — IPRATROPIUM BROMIDE 0.03 % NA SOLN: 2.0000 | Freq: Two times a day (BID) | NASAL | 0 refills | Status: DC | PRN

## 2020-11-04 MED ORDER — CEFDINIR 300 MG PO CAPS: 300.0000 mg | ORAL_CAPSULE | Freq: Two times a day (BID) | ORAL | 0 refills | Status: DC

## 2020-11-04 MED ORDER — DEXAMETHASONE 6 MG PO TABS: 6.0000 mg | ORAL_TABLET | Freq: Two times a day (BID) | ORAL | 0 refills | Status: DC

## 2020-11-04 NOTE — Progress Notes (Signed)
Virtual Visit via Video Note  I connected with Anita Bryant on 11/04/20 at  9:40 AM EST by a video enabled telemedicine application and verified that I am speaking with the correct person using two identifiers.   I discussed the limitations of evaluation and management by telemedicine and the availability of in person appointments. The patient expressed understanding and agreed to proceed.  Patient location: home Provider locations: office  Subjective:    CC: respiratory s/s, right neck pain  HPI: Pleasant 40 year old female presenting via MyChart video visit with reports of 12 days of upper respiratory symptoms including nasal congestion, rhinorrhea, postnasal drip, headache, dizziness, nonproductive cough, fatigue, and intermittent dizziness.  Notes that when she bends over she feels as if she has water in her ear and is dizzy on standing back up.  Denies fever, chills, sore throat, and GI symptoms.  Over the last couple days that she has developed right neck pain along the anterior and lateral right neck.  Notes that her neck feels stiff and difficult to turn because of this pain and tenderness.  She has been taking Robitussin, DayQuil, NyQuil, and ibuprofen with minimal relief.  She did Covid test 5 days ago with a negative result.  Is planning to leave on Monday for her vacation to [place] to celebrate her husband's 40th birthday.  Has had 2 for COVID vaccines but no documented booster.  Past medical history, Surgical history, Family history not pertinant except as noted below, Social history, Allergies, and medications have been entered into the medical record, reviewed, and corrections made.   Review of Systems: See HPI for pertinent positives and negatives.   Objective:    General: Speaking clearly in complete sentences without any shortness of breath.  Alert and oriented x3.  Normal judgment. No apparent acute distress.  Impression and Recommendations:    1.  Sinobronchitis Cefdinir twice daily x10 days.  Because of the severity of her symptoms, we will go ahead and do Decadron 6 mg twice daily x5 days.  Sending in Atrovent to use for rhinorrhea twice daily as needed.  Okay to continue ibuprofen and over-the-counter cold medications for symptom relief.  Discussed cough syrup but she reports the over-the-counter Robitussin does calm her cough down well.  I discussed the assessment and treatment plan with the patient. The patient was provided an opportunity to ask questions and all were answered. The patient agreed with the plan and demonstrated an understanding of the instructions.   The patient was advised to call back or seek an in-person evaluation if the symptoms worsen or if the condition fails to improve as anticipated.  20 minutes of non-face-to-face time was provided during this encounter.  Return if symptoms worsen or fail to improve.  Clearnce Sorrel, DNP, APRN, FNP-BC [facility]

## 2020-11-06 ENCOUNTER — Other Ambulatory Visit: Payer: Self-pay

## 2020-11-06 ENCOUNTER — Telehealth: Payer: No Typology Code available for payment source | Admitting: Nurse Practitioner

## 2020-11-06 ENCOUNTER — Encounter (HOSPITAL_BASED_OUTPATIENT_CLINIC_OR_DEPARTMENT_OTHER): Payer: Self-pay | Admitting: Emergency Medicine

## 2020-11-06 ENCOUNTER — Encounter: Payer: Self-pay | Admitting: Medical-Surgical

## 2020-11-06 DIAGNOSIS — J069 Acute upper respiratory infection, unspecified: Secondary | ICD-10-CM | POA: Diagnosis not present

## 2020-11-06 DIAGNOSIS — R519 Headache, unspecified: Secondary | ICD-10-CM | POA: Insufficient documentation

## 2020-11-06 DIAGNOSIS — R112 Nausea with vomiting, unspecified: Secondary | ICD-10-CM | POA: Insufficient documentation

## 2020-11-06 DIAGNOSIS — J4520 Mild intermittent asthma, uncomplicated: Secondary | ICD-10-CM | POA: Diagnosis not present

## 2020-11-06 DIAGNOSIS — R1115 Cyclical vomiting syndrome unrelated to migraine: Secondary | ICD-10-CM

## 2020-11-06 DIAGNOSIS — Z20822 Contact with and (suspected) exposure to covid-19: Secondary | ICD-10-CM | POA: Diagnosis not present

## 2020-11-06 DIAGNOSIS — R1013 Epigastric pain: Secondary | ICD-10-CM | POA: Diagnosis not present

## 2020-11-06 MED ORDER — ONDANSETRON 4 MG PO TBDP: 4.0000 mg | ORAL_TABLET | Freq: Three times a day (TID) | ORAL | 0 refills | Status: DC | PRN

## 2020-11-06 NOTE — ED Triage Notes (Addendum)
Reports she's had vomiting since 0100 today, reports taking dexamethasone and cefdinir since Thursday for "sinus infection" after virtual visit on Thursday. Negative covid test this week

## 2020-11-06 NOTE — Progress Notes (Signed)
We are sorry that you are not feeling well. Here is how we plan to help!  Based on what you have shared with me it looks like you have a Virus that is irritating your GI tract.  Vomiting is the forceful emptying of a portion of the stomach's content through the mouth.  Although nausea and vomiting can make you feel miserable, it's important to remember that these are not diseases, but rather symptoms of an underlying illness.  When we treat short term symptoms, we always caution that any symptoms that persist should be fully evaluated in a medical office.  I have prescribed a medication that will help alleviate your symptoms and allow you to stay hydrated:  Zofran 4 mg 1 tablet every 8 hours as needed for nausea and vomiting. Once you take zofran, try taking your other meds prescribrd about 20 minutes later.  HOME CARE:  Drink clear liquids.  This is very important! Dehydration (the lack of fluid) can lead to a serious complication.  Start off with 1 tablespoon every 5 minutes for 8 hours.  You may begin eating bland foods after 8 hours without vomiting.  Start with saltine crackers, white bread, rice, mashed potatoes, applesauce.  After 48 hours on a bland diet, you may resume a normal diet.  Try to go to sleep.  Sleep often empties the stomach and relieves the need to vomit.  GET HELP RIGHT AWAY IF:   Your symptoms do not improve or worsen within 2 days after treatment.  You have a fever for over 3 days.  You cannot keep down fluids after trying the medication.  MAKE SURE YOU:   Understand these instructions.  Will watch your condition.  Will get help right away if you are not doing well or get worse.   Thank you for choosing an e-visit. Your e-visit answers were reviewed by a board certified advanced clinical practitioner to complete your personal care plan. Depending upon the condition, your plan could have included both over the counter or prescription medications. Please  review your pharmacy choice. Be sure that the pharmacy you have chosen is open so that you can pick up your prescription now.  If there is a problem you may message your provider in [place] to have the prescription routed to another pharmacy. Your safety is important to [place]. If you have drug allergies check your prescription carefully.  For the next 24 hours, you can use MyChart to ask questions about today's visit, request a non-urgent call back, or ask for a work or school excuse from your e-visit provider. You will get an e-mail in the next two days asking about your experience. I hope that your e-visit has been valuable and will speed your recovery.  5-10 minutes spent reviewing and documenting in chart.

## 2020-11-07 ENCOUNTER — Emergency Department (HOSPITAL_BASED_OUTPATIENT_CLINIC_OR_DEPARTMENT_OTHER)
Admission: EM | Admit: 2020-11-07 | Discharge: 2020-11-07 | Disposition: A | Discharge status: Home / Self Care | Payer: No Typology Code available for payment source | Attending: Emergency Medicine | Admitting: Emergency Medicine

## 2020-11-07 DIAGNOSIS — J069 Acute upper respiratory infection, unspecified: Secondary | ICD-10-CM

## 2020-11-07 DIAGNOSIS — R112 Nausea with vomiting, unspecified: Secondary | ICD-10-CM

## 2020-11-07 LAB — COMPREHENSIVE METABOLIC PANEL
ALT: 16 U/L (ref 0–44)
AST: 16 U/L (ref 15–41)
Albumin: 4.3 g/dL (ref 3.5–5.0)
Alkaline Phosphatase: 75 U/L (ref 38–126)
Anion gap: 11 (ref 5–15)
BUN: 24 mg/dL — ABNORMAL HIGH (ref 6–20)
CO2: 26 mmol/L (ref 22–32)
Calcium: 9.1 mg/dL (ref 8.9–10.3)
Chloride: 101 mmol/L (ref 98–111)
Creatinine, Ser: 0.86 mg/dL (ref 0.44–1.00)
GFR, Estimated: >60 mL/min (ref >60)
Glucose, Bld: 106 mg/dL — ABNORMAL HIGH (ref 70–99)
Potassium: 3.3 mmol/L — ABNORMAL LOW (ref 3.5–5.1)
Sodium: 138 mmol/L (ref 135–145)
Total Bilirubin: 0.7 mg/dL (ref 0.3–1.2)
Total Protein: 7.5 g/dL (ref 6.5–8.1)

## 2020-11-07 LAB — CBC WITH DIFFERENTIAL/PLATELET
Abs Immature Granulocytes: 0.05 10*3/uL (ref 0.00–0.07)
Basophils Absolute: 0 10*3/uL (ref 0.0–0.1)
Basophils Relative: 0 %
Eosinophils Absolute: 0 10*3/uL (ref 0.0–0.5)
Eosinophils Relative: 0 %
HCT: 41.4 % (ref 36.0–46.0)
Hemoglobin: 13.8 g/dL (ref 12.0–15.0)
Immature Granulocytes: 0 %
Lymphocytes Relative: 15 %
Lymphs Abs: 2.2 10*3/uL (ref 0.7–4.0)
MCH: 27.5 pg (ref 26.0–34.0)
MCHC: 33.3 g/dL (ref 30.0–36.0)
MCV: 82.6 fL (ref 80.0–100.0)
Monocytes Absolute: 1 10*3/uL (ref 0.1–1.0)
Monocytes Relative: 7 %
Neutro Abs: 11.6 10*3/uL — ABNORMAL HIGH (ref 1.7–7.7)
Neutrophils Relative %: 78 %
Platelets: 385 10*3/uL (ref 150–400)
RBC: 5.01 MIL/uL (ref 3.87–5.11)
RDW: 13.2 % (ref 11.5–15.5)
WBC: 14.8 10*3/uL — ABNORMAL HIGH (ref 4.0–10.5)
nRBC: 0 % (ref 0.0–0.2)

## 2020-11-07 LAB — PREGNANCY, URINE: Preg Test, Ur: NEGATIVE

## 2020-11-07 LAB — SARS CORONAVIRUS 2 (TAT 6-24 HRS): SARS Coronavirus 2: NEGATIVE

## 2020-11-07 LAB — LIPASE, BLOOD: Lipase: 28 U/L (ref 11–51)

## 2020-11-07 MED ORDER — ONDANSETRON HCL 4 MG/2ML IJ SOLN
4.0000 mg | Freq: Once | INTRAMUSCULAR | Status: AC
  Administered 2020-11-07: 4 mg via INTRAVENOUS
  Filled 2020-11-07: qty 2

## 2020-11-07 MED ORDER — SODIUM CHLORIDE 0.9 % IV BOLUS
1000.0000 mL | Freq: Once | INTRAVENOUS | Status: AC
  Administered 2020-11-07: 1000 mL via INTRAVENOUS

## 2020-11-07 MED ORDER — DICYCLOMINE HCL 20 MG PO TABS: 20.0000 mg | ORAL_TABLET | Freq: Three times a day (TID) | ORAL | 0 refills | Status: DC | PRN

## 2020-11-07 MED ORDER — PANTOPRAZOLE SODIUM 40 MG PO TBEC: 40.0000 mg | DELAYED_RELEASE_TABLET | Freq: Every day | ORAL | 0 refills | Status: DC

## 2020-11-07 NOTE — ED Notes (Signed)
ED Provider at bedside. 

## 2020-11-07 NOTE — Discharge Instructions (Addendum)
You were seen in the emerge department today with nausea and vomiting.  We are sending a second test for COVID-19 and the test results will come back in the next 1 to 2 days on the MyChart app.  Continue your nausea medicines at home.  I have called into additional medicines to your pharmacy to help with belly cramping and discomfort.  Please remain isolated from others until your Covid test comes back negative.   If you develop worsening symptoms, abdominal pain, fevers you should return to the emergency department for reevaluation.

## 2020-11-07 NOTE — ED Provider Notes (Signed)
Emergency Department Provider Note   I have reviewed the triage vital signs and the nursing notes.   HISTORY  Chief Complaint Emesis   HPI Anita Bryant is a 40 y.o. female with past medical history of asthma presents to the emergency department with vomiting over the past several days.  She has had some associated nasal congestion with cough, body aches, headache.  She reports taking it at home Covid antigen test as well as PCR test 3 days ago which were negative for Covid at that time.  She denies any specific chest pain or shortness of breath.  She had a virtual visit with her primary care doctor who prescribed antibiotic along with steroid but patient has developed vomiting since that time.  She describes cyclical type vomiting occurring approximately every 2 hours whether she eats or not.  She not experiencing fevers or shaking chills.  She is having some abdominal discomfort mainly in the midepigastric region which occasionally radiates more diffusely.  No blood in the emesis or stool. Denies EtOH or illicit drug use.   Past Medical History:  Diagnosis Date  . Asthma   . GERD (gastroesophageal reflux disease)   . History of nephrolithiasis     Patient Active Problem List   Diagnosis Date Noted  . Hashimoto's disease 05/26/2020  . Vitamin D deficiency 05/26/2020  . Right ankle pain 05/25/2020  . Subclinical hypothyroidism 05/10/2020  . Mild intermittent asthma without complication 68/34/1961  . Family history of cancer in father 05/10/2020  . Close exposure to COVID-19 virus 09/24/2019  . Increased thyroid stimulating hormone (TSH) level 12/22/2018  . Asthma with acute exacerbation 11/07/2018  . Deviated nasal septum 10/30/2018  . Chronic rhinitis 10/30/2018  . Carpal tunnel syndrome on left 10/30/2018  . Family history of thyroid disease in mother 10/30/2018  . History of nephrolithiasis     Past Surgical History:  Procedure Laterality Date  . ANKLE SURGERY Right    . CARPAL TUNNEL WITH CUBITAL TUNNEL    . KIDNEY STONE SURGERY      Allergies Patient has no known allergies.  Family History  Problem Relation Age of Onset  . Thyroid disease Mother   . Colon cancer Maternal Great-grandfather   . Cancer Father        MDS cancer, Swed's disease    Social History Social History   Tobacco Use  . Smoking status: Never Smoker  . Smokeless tobacco: Never Used  Vaping Use  . Vaping Use: Never used  Substance Use Topics  . Alcohol use: Never  . Drug use: Never    Review of Systems  Constitutional: No fever/chills. Positive body aches.  Eyes: No visual changes. ENT: No sore throat. Positive nasal congestion.  Cardiovascular: Denies chest pain. Respiratory: Denies shortness of breath. Mild cough.  Gastrointestinal: Positive abdominal pain. Positive nausea and vomiting.  No diarrhea.  No constipation. Genitourinary: Negative for dysuria. Musculoskeletal: Negative for back pain. Skin: Negative for rash. Neurological: Negative for focal weakness or numbness. Positive HA.   10-point ROS otherwise negative.  ____________________________________________   PHYSICAL EXAM:  VITAL SIGNS: ED Triage Vitals  Enc Vitals Group     BP 11/06/20 2234 132/86     Pulse Rate 11/06/20 2234 92     Resp 11/06/20 2234 16     Temp 11/06/20 2234 98 F (36.7 C)     Temp src --      SpO2 11/06/20 2234 100 %     Weight 11/06/20 2231  170 lb (77.1 kg)   Constitutional: Alert and oriented. Well appearing and in no acute distress. Eyes: Conjunctivae are normal.  Head: Atraumatic. Nose: Mild congestion/rhinnorhea. Mouth/Throat: Mucous membranes are moist.  Oropharynx non-erythematous. No PTA. Clear voice. Managing oral secretions.  Neck: No stridor.   Cardiovascular: Normal rate, regular rhythm. Good peripheral circulation. Grossly normal heart sounds.   Respiratory: Normal respiratory effort.  No retractions. Lungs CTAB. Gastrointestinal: Soft with mild  mid-epigastric tenderness. No focal RUQ or lower abdominal tenderness. Negative Murphy's sign. No distention.  Musculoskeletal: No gross deformities of extremities. Neurologic:  Normal speech and language.  Skin:  Skin is warm, dry and intact. No rash noted.   ____________________________________________   LABS (all labs ordered are listed, but only abnormal results are displayed)  Labs Reviewed  COMPREHENSIVE METABOLIC PANEL - Abnormal; Notable for the following components:      Result Value   Potassium 3.3 (*)    Glucose, Bld 106 (*)    BUN 24 (*)    All other components within normal limits  CBC WITH DIFFERENTIAL/PLATELET - Abnormal; Notable for the following components:   WBC 14.8 (*)    Neutro Abs 11.6 (*)    All other components within normal limits  SARS CORONAVIRUS 2 (TAT 6-24 HRS)  LIPASE, BLOOD  PREGNANCY, URINE   ____________________________________________   PROCEDURES  Procedure(s) performed:   Procedures  None  ____________________________________________   INITIAL IMPRESSION / ASSESSMENT AND PLAN / ED COURSE  Pertinent labs & imaging results that were available during my care of the patient were reviewed by me and considered in my medical decision making (see chart for details).   Patient presents emergency department with viral URI type symptoms with nausea and vomiting.  No focal or severe tenderness on exam.  Some epigastric abdominal discomfort seems most consistent with gastritis.  Low suspicion for acute cholecystitis.  Patient does not have history of diabetes.  Low suspicion for index presentation of DKA but will obtain labs with multiple days of intractable nausea and vomiting.  Will give IV fluids along with nausea medication IV.  Also for repeat Covid PCR testing.   Patient's labs showing mild leukocytosis of 14.8 with normal LFTs and lipase.  Pregnancy test is negative.  Suspect that the leukocytosis may be related to the patient's steroid  course.  Her abdomen is diffusely soft and nontender with the exception of some midepigastric tenderness.  My suspicion for underlying acute appendicitis, cholecystitis is exceedingly low given exam.  Patient is feeling much better after treatment in the emergency department. Will defer emergent imaging for now.  Patient to remain in quarantine pending her COVID-19 test results which will come through in the MyChart app.  She does have access to this by patient's report.  Information provided discharge about how to set this up on her phone if she does not. ____________________________________________  FINAL CLINICAL IMPRESSION(S) / ED DIAGNOSES  Final diagnoses:  Non-intractable vomiting with nausea, unspecified vomiting type  Upper respiratory tract infection, unspecified type     MEDICATIONS GIVEN DURING THIS VISIT:  Medications  sodium chloride 0.9 % bolus 1,000 mL (0 mLs Intravenous Stopped 11/07/20 0201)  ondansetron (ZOFRAN) injection 4 mg (4 mg Intravenous Given 11/07/20 0049)     NEW OUTPATIENT MEDICATIONS STARTED DURING THIS VISIT:  Discharge Medication List as of 11/07/2020  2:17 AM    START taking these medications   Details  dicyclomine (BENTYL) 20 MG tablet Take 1 tablet (20 mg total) by mouth 3 (  three) times daily as needed for spasms., Starting Sun 11/07/2020, Normal    pantoprazole (PROTONIX) 40 MG tablet Take 1 tablet (40 mg total) by mouth daily., Starting Sun 11/07/2020, Until Tue 12/07/2020, Normal        Note:  This document was prepared using Dragon voice recognition software and may include unintentional dictation errors.  Nanda Quinton, MD, [facility]    Zameer Borman, [facility], MD 11/07/20 [phone]

## 2020-11-08 NOTE — Telephone Encounter (Signed)
Pt was seen in Urgent Care.

## 2020-12-28 ENCOUNTER — Other Ambulatory Visit: Payer: Self-pay

## 2020-12-28 ENCOUNTER — Encounter: Payer: Self-pay | Admitting: Internal Medicine

## 2020-12-28 ENCOUNTER — Ambulatory Visit: Payer: No Typology Code available for payment source | Admitting: Internal Medicine

## 2020-12-28 VITALS — BP 120/82 | HR 89 | Ht 62.5 in | Wt 170.0 lb

## 2020-12-28 DIAGNOSIS — E559 Vitamin D deficiency, unspecified: Secondary | ICD-10-CM

## 2020-12-28 DIAGNOSIS — E063 Autoimmune thyroiditis: Secondary | ICD-10-CM | POA: Diagnosis not present

## 2020-12-28 NOTE — Progress Notes (Signed)
Name: Anita Bryant  MRN/ DOB: [id number], 07/05/1981    Age/ Sex: 40 y.o., female     PCP: Samuel Bouche, NP   Reason for Endocrinology Evaluation: Subclinical hypothyroidism     Initial Endocrinology Clinic Visit: 05/25/2020    PATIENT IDENTIFIER: [name] is a 40 y.o., female with a past medical history of Asthma. She has followed with [facility] since 05/25/2020 for consultative assistance with management of her subclinical hypothyroidism  HISTORICAL SUMMARY:   Pt has been noted to have an intermittent elevated in TSH since 2011 with a max level of 6.0 uIU/mL in 04/2020 with normal FT4 at 1.3 ng/dL and normal TT3 at 125 ng/dL.  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   TSH has been trending down and we opted to monitor without LT-4 replacement.   She has noted constipation  She has been irritable   She has noted weight loss, attributes to exercise.    Of note, she was noted to have a low Vitamin D at 19 ng/mL in 04/2020 she was on Ergocalciferol 50,000 iu weekly but she stopped it before her sx so we opted OTC vitamin D 2000 iu daily      HISTORY:  Past Medical History:  Past Medical History:  Diagnosis Date  . Asthma   . GERD (gastroesophageal reflux disease)   . History of nephrolithiasis    Past Surgical History:  Past Surgical History:  Procedure Laterality Date  . ANKLE SURGERY Right   . CARPAL TUNNEL WITH CUBITAL TUNNEL    . KIDNEY STONE SURGERY      Social History:  reports that she has never smoked. She has never used smokeless tobacco. She reports that she does not drink alcohol and does not use drugs. Family History:  Family History  Problem Relation Age of Onset  . Thyroid disease Mother   . Colon cancer Maternal Great-grandfather   . Cancer Father         MDS cancer, Swed's disease     HOME MEDICATIONS: Allergies as of 12/28/2020   No Known Allergies     Medication List       Accurate as of December 28, 2020  2:15 PM. If you have any questions, ask your nurse or doctor.        STOP taking these medications   cefdinir 300 MG capsule Commonly known as: OMNICEF Stopped by: Dorita Sciara, MD   dexamethasone 6 MG tablet Commonly known as: DECADRON Stopped by: Dorita Sciara, MD   dicyclomine 20 MG tablet Commonly known as: BENTYL Stopped by: Dorita Sciara, MD   doxycycline 100 MG tablet Commonly known as: VIBRA-TABS Stopped by: Dorita Sciara, MD   ondansetron 4 MG disintegrating tablet Commonly known as: Zofran ODT Stopped by: Dorita Sciara, MD     TAKE these medications   acetaminophen 325 MG tablet Commonly known as: TYLENOL Take 650 mg by mouth every 4 (four) hours as needed.   albuterol 108 (90 Base) MCG/ACT inhaler Commonly known as: VENTOLIN HFA Inhale 1-2 puffs into the lungs every 4 (four) hours as needed for wheezing or shortness of breath.   clobetasol cream 0.05 % Commonly known as: TEMOVATE Apply 1 application topically 2 (two) times daily as needed.   ipratropium 0.03 %  nasal spray Commonly known as: ATROVENT Place 2 sprays into both nostrils every 12 (twelve) hours as needed for rhinitis.   pantoprazole 40 MG tablet Commonly known as: PROTONIX Take 1 tablet (40 mg total) by mouth daily.   Prenatal One Daily 27-0.8 MG Tabs Take 1 tablet by mouth daily.         OBJECTIVE:   PHYSICAL EXAM: VS: BP 120/82   Pulse 89   Ht 5' 2.5" (1.588 m)   Wt 170 lb (77.1 kg)   LMP 12/12/2020   SpO2 99%   BMI 30.60 kg/m    EXAM: General: Pt appears well and is in NAD  Neck: General: Supple without adenopathy. Thyroid: Thyroid size normal.  No goiter or nodules appreciated.  Lungs: Clear with good BS bilat with no rales, rhonchi, or wheezes  Heart: Auscultation: RRR.   Abdomen: Normoactive bowel sounds, soft, nontender, without masses or organomegaly palpable  Extremities:  BL LE: No pretibial edema normal ROM and strength.  Mental Status: Judgment, insight: Intact Orientation: Oriented to time, place, and person Mood and affect: No depression, anxiety, or agitation     DATA REVIEWED:  Results for Anita Bryant, Anita Bryant (MRN [id number]) as of 12/29/2020 16:31  Ref. Range 12/28/2020 14:28  VITD Latest Ref Range: 30.00 - 100.00 ng/mL 27.78 (L)  TSH Latest Ref Range: 0.35 - 4.50 uIU/mL 5.51 (H)  T4,Free(Direct) Latest Ref Range: 0.60 - 1.60 ng/dL 0.91   Results for Anita Bryant, Anita Bryant (MRN [id number]) as of 08/24/2020 08:30  Ref. Range 05/25/2020 10:57  Thyroperoxidase Ab SerPl-aCnc Latest Ref Range: <9 IU/mL 20 (H)    ASSESSMENT / PLAN / RECOMMENDATIONS:    1.  Hashimoto's Thyroiditis   - Pt with non-specific symptoms  - No local neck symptoms  - Repeat TSH is stable. Will continue to monitor, she was offered LT-4 replacement in the past but opted to wait as she was not feeling poorly.      2. Vitamin D Deficiency:  -Vitamin D is improving    Continue Vitamin D3 2000 iu daily      F/U in 1 yr   Signed electronically by: Mack Guise, MD  [facility]  [facility] [address], [place] [address] Phone: [phone] FAX: [phone]      CC: Samuel Bouche, [address] [place] [address] Phone: [phone]  Fax: [phone]   Return to Endocrinology clinic as below: No future appointments.

## 2020-12-29 LAB — T4, FREE: Free T4: 0.91 ng/dL (ref 0.60–1.60)

## 2020-12-29 LAB — VITAMIN D 25 HYDROXY (VIT D DEFICIENCY, FRACTURES): VITD: 27.78 ng/mL — ABNORMAL LOW (ref 30.00–100.00)

## 2020-12-29 LAB — TSH: TSH: 5.51 u[IU]/mL — ABNORMAL HIGH (ref 0.35–4.50)

## 2021-01-17 ENCOUNTER — Encounter: Payer: Self-pay | Admitting: Medical-Surgical

## 2021-01-17 ENCOUNTER — Telehealth (INDEPENDENT_AMBULATORY_CARE_PROVIDER_SITE_OTHER): Payer: No Typology Code available for payment source | Admitting: Medical-Surgical

## 2021-01-17 DIAGNOSIS — R300 Dysuria: Secondary | ICD-10-CM

## 2021-01-17 DIAGNOSIS — M545 Low back pain, unspecified: Secondary | ICD-10-CM | POA: Diagnosis not present

## 2021-01-17 DIAGNOSIS — Z87442 Personal history of urinary calculi: Secondary | ICD-10-CM | POA: Diagnosis not present

## 2021-01-17 LAB — POCT URINALYSIS DIP (CLINITEK)
Glucose, UA: NEGATIVE mg/dL
Leukocytes, UA: NEGATIVE
Nitrite, UA: NEGATIVE
POC PROTEIN,UA: 100 — AB
Spec Grav, UA: 1.025 (ref 1.010–1.025)
Urobilinogen, UA: 1 E.U./dL
pH, UA: 6 (ref 5.0–8.0)

## 2021-01-17 MED ORDER — TAMSULOSIN HCL 0.4 MG PO CAPS: 0.4000 mg | ORAL_CAPSULE | Freq: Every day | ORAL | 0 refills | Status: DC

## 2021-01-17 MED ORDER — TRAMADOL HCL 50 MG PO TABS: 50.0000 mg | ORAL_TABLET | Freq: Three times a day (TID) | ORAL | 0 refills | Status: AC | PRN

## 2021-01-17 NOTE — Progress Notes (Signed)
Virtual Visit via Video Note  I connected with Anita Bryant on 01/17/21 at  1:40 PM EDT by a video enabled telemedicine application and verified that I am speaking with the correct person using two identifiers.   I discussed the limitations of evaluation and management by telemedicine and the availability of in person appointments. The patient expressed understanding and agreed to proceed.  Patient location: home Provider locations: office  Subjective:    CC: Back pain, dysuria  HPI: Pleasant 40 year old female presenting via MyChart video visit with reports of low back pain and dysuria that started approximately 10 days ago.  Notes her back pain started on the lower part of the right side similar to when you sitting in her car tilt.  Unfortunately after a few days it migrated to the higher part of the lower back.  She notes that she has had an increase in urination.  She is going more often but notes there are times when she voids a lot and other times when there is very little.  Her pain is rated at 3-4 out of 10.  She has taken ibuprofen intermittently, 400 mg every 4 hours as needed.  It does help some but not greatly.  She had some lower abdominal cramping noted her period was late.  She took a home pregnancy test with negative results and started her period a week later.  For that test, she urinated in a cup and noticed that her urine was darker than usual.  She has had some episodes where she wakes up freezing cold and then gets hot and sweaty.  She is also had some mild nausea but no vomiting.  Decreased appetite and generalized malaise.  Denies fever, chills, diarrhea, and constipation.  Is worried because when she had a kidney stone previously, she was told that it was small with pass on its own.  Unfortunately did not and ended up with emergency surgery after finding hydronephrosis.   Past medical history, Surgical history, Family history not pertinant except as noted below, Social  history, Allergies, and medications have been entered into the medical record, reviewed, and corrections made.   Review of Systems: See HPI for pertinent positives and negatives.   Objective:    General: Speaking clearly in complete sentences without any shortness of breath.  Alert and oriented x3.  Normal judgment. No apparent acute distress.  Impression and Recommendations:    1. Low back pain, unspecified back pain laterality, unspecified chronicity, unspecified whether sciatica present 2. Dysuria 3. History of nephrolithiasis Discussed current symptoms.  With her history of nephrolithiasis, concerning for recurrence of kidney stones.  She was able to come by the office and give [place] a urine sample.  POCT UA positive for large amounts of blood, protein, trace ketones, and small bilirubin.  She is currently on her period so this is likely skewed.  Sending for culture.  We will go ahead and get a stat renal ultrasound for further evaluation.  Sending in tramadol 50 mg every 8 hours as needed for pain.  Okay to continue ibuprofen as needed.  We will go ahead and send in Flomax 0.4 mg daily and if ultrasound is negative, will be okay to stop that.  Increase oral fluid intake to help flush system. - POCT URINALYSIS DIP (CLINITEK) - Urine Culture - US Renal; Future  I discussed the assessment and treatment plan with the patient. The patient was provided an opportunity to ask questions and all were answered. The patient  agreed with the plan and demonstrated an understanding of the instructions.   The patient was advised to call back or seek an in-person evaluation if the symptoms worsen or if the condition fails to improve as anticipated.  20 minutes of non-face-to-face time was provided during this encounter.  Return if symptoms worsen or fail to improve.  Clearnce Sorrel, DNP, APRN, FNP-BC [facility]

## 2021-01-18 ENCOUNTER — Other Ambulatory Visit: Payer: Self-pay

## 2021-01-18 ENCOUNTER — Ambulatory Visit (HOSPITAL_BASED_OUTPATIENT_CLINIC_OR_DEPARTMENT_OTHER)
Admission: RE | Admit: 2021-01-18 | Discharge: 2021-01-18 | Disposition: A | Discharge status: Home / Self Care | Payer: No Typology Code available for payment source | Source: Ambulatory Visit | Attending: Medical-Surgical | Admitting: Medical-Surgical

## 2021-01-18 DIAGNOSIS — R300 Dysuria: Secondary | ICD-10-CM | POA: Insufficient documentation

## 2021-01-18 DIAGNOSIS — Z87442 Personal history of urinary calculi: Secondary | ICD-10-CM

## 2021-01-18 DIAGNOSIS — M545 Low back pain, unspecified: Secondary | ICD-10-CM | POA: Diagnosis not present

## 2021-01-19 ENCOUNTER — Encounter: Payer: Self-pay | Admitting: Medical-Surgical

## 2021-01-19 LAB — URINE CULTURE
MICRO NUMBER:: 11812936
SPECIMEN QUALITY:: ADEQUATE

## 2021-01-20 NOTE — Progress Notes (Signed)
Subjective:    CC: right flank and back pain  HPI: Pleasant 40 year old female presenting today for continued right flank and back pain.  She was evaluated during a virtual appointment earlier this week for urinary frequency and back pain that wraps around her right flank and into her right upper and lower abdomen.  Her urine showed hematuria but the culture was negative.  Today, she presents with continued discomfort rated at 3/10 in the same area.  Her urinary frequency has resolved but she is still experiencing continuous significant discomfort.  Having regular bowel movements.  Denies nausea, vomiting, vaginal discharge/odor.  She did have chills at the onset of her symptoms but these have resolved.  Earlier this week, we started Flomax and tramadol due to a history of nephrolithiasis.  We did do a renal ultrasound but this came back negative for kidney stones, positive for incidental finding of fatty liver.  She took a couple doses of Flomax and then stopped this in my instruction.  Notes tramadol helped to relax her and make her drowsy but did not resolve her pain.  She has had no recent injuries, falls, or activities that would predispose her to abdominal muscle strain  I reviewed the past medical history, family history, social history, surgical history, and allergies today and no changes were needed.  Please see the problem list section below in epic for further details.  Past Medical History: Past Medical History:  Diagnosis Date  . Asthma   . GERD (gastroesophageal reflux disease)   . History of nephrolithiasis    Past Surgical History: Past Surgical History:  Procedure Laterality Date  . ANKLE SURGERY Right   . CARPAL TUNNEL WITH CUBITAL TUNNEL    . KIDNEY STONE SURGERY     Social History: Social History   Socioeconomic History  . Marital status: Married    Spouse name: Not on file  . Number of children: Not on file  . Years of education: Not on file  . Highest education  level: Not on file  Occupational History  . Occupation: Cosmetologist  Tobacco Use  . Smoking status: Never Smoker  . Smokeless tobacco: Never Used  Vaping Use  . Vaping Use: Never used  Substance and Sexual Activity  . Alcohol use: Never  . Drug use: Never  . Sexual activity: Yes    Birth control/protection: None  Other Topics Concern  . Not on file  Social History Narrative  . Not on file   Social Determinants of Health   Financial Resource Strain: Not on file  Food Insecurity: Not on file  Transportation Needs: Not on file  Physical Activity: Not on file  Stress: Not on file  Social Connections: Not on file   Family History: Family History  Problem Relation Age of Onset  . Thyroid disease Mother   . Colon cancer Maternal Great-grandfather   . Cancer Father        MDS cancer, Swed's disease   Allergies: No Known Allergies Medications: See med rec.  Review of Systems: See HPI for pertinent positives and negatives.   Objective:    General: Well Developed, well nourished, and in no acute distress.  Neuro: Alert and oriented x3.  HEENT: Normocephalic, atraumatic.  Skin: Warm and dry. Cardiac: Regular rate and rhythm, no murmurs rubs or gallops, no lower extremity edema.  Respiratory: Clear to auscultation bilaterally. Not using accessory muscles, speaking in full sentences. Abdomen: Soft, RUQ/RLQ tenderness, nondistended.  Right flank tenderness.  Bowel sounds +  x 4 quadrants. No HSM appreciated.    Impression and Recommendations:    1. Low back pain, unspecified back pain laterality, unspecified chronicity, unspecified whether sciatica present 2. Abdominal pain, unspecified abdominal location Checking CBC with differential and CMP today.  No findings on ultrasound to explain symptoms.  She does have tenderness to the right upper and lower quadrant as well as the right flank.  Further investigation is needed so we will get a CT abdomen pelvis stat to rule out  kidney stones as well as potential early appendicitis. - CBC with Differential/Platelet - COMPLETE METABOLIC PANEL WITH GFR - CT Abdomen Pelvis Wo Contrast; Future  Return if symptoms worsen or fail to improve. Further follow up pending lab/imaging results. ___________________________________________ Clearnce Sorrel, DNP, APRN, FNP-BC [facility]

## 2021-01-21 ENCOUNTER — Other Ambulatory Visit: Payer: Self-pay

## 2021-01-21 ENCOUNTER — Ambulatory Visit (INDEPENDENT_AMBULATORY_CARE_PROVIDER_SITE_OTHER): Payer: No Typology Code available for payment source

## 2021-01-21 ENCOUNTER — Ambulatory Visit (INDEPENDENT_AMBULATORY_CARE_PROVIDER_SITE_OTHER): Payer: No Typology Code available for payment source | Admitting: Medical-Surgical

## 2021-01-21 ENCOUNTER — Encounter: Payer: Self-pay | Admitting: Medical-Surgical

## 2021-01-21 VITALS — BP 115/79 | HR 83 | Temp 98.6°F | Ht 62.5 in | Wt 171.1 lb

## 2021-01-21 DIAGNOSIS — R109 Unspecified abdominal pain: Secondary | ICD-10-CM

## 2021-01-21 DIAGNOSIS — M545 Low back pain, unspecified: Secondary | ICD-10-CM

## 2021-01-21 LAB — POCT URINALYSIS DIP (CLINITEK)
Bilirubin, UA: NEGATIVE
Glucose, UA: NEGATIVE mg/dL
Ketones, POC UA: NEGATIVE mg/dL
Nitrite, UA: NEGATIVE
POC PROTEIN,UA: NEGATIVE
Spec Grav, UA: 1.01 (ref 1.010–1.025)
Urobilinogen, UA: 0.2 E.U./dL
pH, UA: 7 (ref 5.0–8.0)

## 2021-01-22 LAB — COMPLETE METABOLIC PANEL WITH GFR
AG Ratio: 1.6 (calc) (ref 1.0–2.5)
ALT: 23 U/L (ref 6–29)
AST: 22 U/L (ref 10–30)
Albumin: 4.7 g/dL (ref 3.6–5.1)
Alkaline phosphatase (APISO): 96 U/L (ref 31–125)
BUN: 11 mg/dL (ref 7–25)
CO2: 29 mmol/L (ref 20–32)
Calcium: 9.9 mg/dL (ref 8.6–10.2)
Chloride: 103 mmol/L (ref 98–110)
Creat: 0.79 mg/dL (ref 0.50–1.10)
GFR, Est African American: 110 mL/min/{1.73_m2} (ref >=60)
GFR, Est Non African American: 95 mL/min/{1.73_m2} (ref >=60)
Globulin: 3 g/dL (calc) (ref 1.9–3.7)
Glucose, Bld: 90 mg/dL (ref 65–99)
Potassium: 4.4 mmol/L (ref 3.5–5.3)
Sodium: 139 mmol/L (ref 135–146)
Total Bilirubin: 0.7 mg/dL (ref 0.2–1.2)
Total Protein: 7.7 g/dL (ref 6.1–8.1)

## 2021-01-22 LAB — CBC WITH DIFFERENTIAL/PLATELET
Absolute Monocytes: 422 cells/uL (ref 200–950)
Basophils Absolute: 50 cells/uL (ref 0–200)
Basophils Relative: 0.8 %
Eosinophils Absolute: 161 cells/uL (ref 15–500)
Eosinophils Relative: 2.6 %
HCT: 42.2 % (ref 35.0–45.0)
Hemoglobin: 13.9 g/dL (ref 11.7–15.5)
Lymphs Abs: 1153 cells/uL (ref 850–3900)
MCH: 27 pg (ref 27.0–33.0)
MCHC: 32.9 g/dL (ref 32.0–36.0)
MCV: 82.1 fL (ref 80.0–100.0)
MPV: 9.3 fL (ref 7.5–12.5)
Monocytes Relative: 6.8 %
Neutro Abs: 4414 cells/uL (ref 1500–7800)
Neutrophils Relative %: 71.2 %
Platelets: 364 10*3/uL (ref 140–400)
RBC: 5.14 10*6/uL — ABNORMAL HIGH (ref 3.80–5.10)
RDW: 13.2 % (ref 11.0–15.0)
Total Lymphocyte: 18.6 %
WBC: 6.2 10*3/uL (ref 3.8–10.8)

## 2021-01-24 LAB — URINE CULTURE
MICRO NUMBER:: 11834898
SPECIMEN QUALITY:: ADEQUATE

## 2021-03-04 DIAGNOSIS — R1314 Dysphagia, pharyngoesophageal phase: Secondary | ICD-10-CM | POA: Insufficient documentation

## 2021-03-07 ENCOUNTER — Encounter: Payer: Self-pay | Admitting: Medical-Surgical

## 2021-03-24 ENCOUNTER — Encounter: Payer: Self-pay | Admitting: Medical-Surgical

## 2021-04-18 ENCOUNTER — Ambulatory Visit (INDEPENDENT_AMBULATORY_CARE_PROVIDER_SITE_OTHER): Payer: No Typology Code available for payment source | Admitting: Otolaryngology

## 2021-04-18 ENCOUNTER — Other Ambulatory Visit: Payer: Self-pay

## 2021-04-18 DIAGNOSIS — R1314 Dysphagia, pharyngoesophageal phase: Secondary | ICD-10-CM

## 2021-04-18 NOTE — Progress Notes (Signed)
HPI: Anita Bryant is a 40 y.o. female who presents is referred by by her PCP for evaluation of swallowing difficulties that she has had for 6 or 7 months now.  She states that she sometimes has difficulty initiating swallowing but mostly when the food gets in her throat she has difficulty having the food pass down into her upper esophagus.  She was tried on pantoprazole for couple months initially but this did not seem to help.  She states that she occasionally has a sore throat or pain associated with swallowing cold liquids.  She has seen a previous ENT office that recommended a barium swallow.  However they were not covered under her insurance and she presents here.. She states that she has lost about 8 pounds over the past 6 or 7 months. She does not smoke.  Past Medical History:  Diagnosis Date   Asthma    GERD (gastroesophageal reflux disease)    History of nephrolithiasis    Past Surgical History:  Procedure Laterality Date   ANKLE SURGERY Right    CARPAL TUNNEL WITH CUBITAL TUNNEL     KIDNEY STONE SURGERY     Social History   Socioeconomic History   Marital status: Married    Spouse name: Not on file   Number of children: Not on file   Years of education: Not on file   Highest education level: Not on file  Occupational History   Occupation: Cosmetologist  Tobacco Use   Smoking status: Never   Smokeless tobacco: Never  Vaping Use   Vaping Use: Never used  Substance and Sexual Activity   Alcohol use: Never   Drug use: Never   Sexual activity: Yes    Birth control/protection: None  Other Topics Concern   Not on file  Social History Narrative   Not on file   Social Determinants of Health   Financial Resource Strain: Not on file  Food Insecurity: Not on file  Transportation Needs: Not on file  Physical Activity: Not on file  Stress: Not on file  Social Connections: Not on file   Family History  Problem Relation Age of Onset   Thyroid disease Mother    Colon  cancer Maternal Great-grandfather    Cancer Father        MDS cancer, Swed's disease   No Known Allergies Prior to Admission medications   Medication Sig Start Date End Date Taking? Authorizing Provider  acetaminophen (TYLENOL) 325 MG tablet Take 650 mg by mouth every 4 (four) hours as needed.    [provider]  albuterol (VENTOLIN HFA) 108 (90 Base) MCG/ACT inhaler Inhale 1-2 puffs into the lungs every 4 (four) hours as needed for wheezing or shortness of breath. 05/10/20   Samuel Bouche, NP  clobetasol cream (TEMOVATE) [id number] % Apply 1 application topically 2 (two) times daily as needed. 05/10/20   Samuel Bouche, NP  ipratropium (ATROVENT) 0.03 % nasal spray Place 2 sprays into both nostrils every 12 (twelve) hours as needed for rhinitis. 11/04/20   Samuel Bouche, NP  Prenatal Vit-Fe Fumarate-FA (PRENATAL ONE DAILY) 27-0.8 MG TABS Take 1 tablet by mouth daily. 10/30/18   Trixie Dredge, PA-C  tamsulosin (FLOMAX) 0.4 MG CAPS capsule Take 1 capsule (0.4 mg total) by mouth daily. 01/17/21   Samuel Bouche, NP     Positive ROS: Otherwise negative  All other systems have been reviewed and were otherwise negative with the exception of those mentioned in the HPI and as above.  Physical Exam: Constitutional: Alert, well-appearing, no acute distress.  She has no hoarseness.  Ears: External ears without lesions or tenderness. Ear canals are clear bilaterally with intact, clear TMs.  Nasal: External nose without lesions. Septum with mild deformity.. Clear nasal passages otherwise. Oral: Lips and gums without lesions. Tongue and palate mucosa without lesions. Posterior oropharynx clear.  Tonsils are symmetric in appearance.  No exudate and no swelling.  Indirect laryngoscopy revealed a very strong gag reflex.  After topical anesthesia and direct laryngoscopy was able to be performed in the vocal cords were clear bilaterally.  The base of tongue vallecula epiglottis were normal.  Both piriform  sinuses were clear.  No mucosal abnormalities noted. Neck: No palpable adenopathy or masses on either side of the neck. Respiratory: Breathing comfortably  Skin: No facial/neck lesions or rash noted.  Procedures  Assessment: Dysphagia questionable etiology.  Normal upper airway examination.  Plan: We will plan on scheduling patient for a barium swallow to see if there is any structural abnormalities.   Radene Journey, MD   CC:

## 2021-05-03 ENCOUNTER — Other Ambulatory Visit (INDEPENDENT_AMBULATORY_CARE_PROVIDER_SITE_OTHER): Payer: Self-pay | Admitting: Otolaryngology

## 2021-05-03 DIAGNOSIS — R1314 Dysphagia, pharyngoesophageal phase: Secondary | ICD-10-CM

## 2021-05-05 ENCOUNTER — Other Ambulatory Visit (HOSPITAL_COMMUNITY): Payer: Self-pay

## 2021-05-05 DIAGNOSIS — R1310 Dysphagia, unspecified: Secondary | ICD-10-CM

## 2021-05-16 ENCOUNTER — Ambulatory Visit (HOSPITAL_COMMUNITY)
Admission: RE | Admit: 2021-05-16 | Discharge: 2021-05-16 | Disposition: A | Discharge status: Home / Self Care | Payer: No Typology Code available for payment source | Source: Ambulatory Visit | Attending: Otolaryngology | Admitting: Otolaryngology

## 2021-05-16 ENCOUNTER — Other Ambulatory Visit: Payer: Self-pay

## 2021-05-16 DIAGNOSIS — R1310 Dysphagia, unspecified: Secondary | ICD-10-CM

## 2021-05-16 DIAGNOSIS — R1314 Dysphagia, pharyngoesophageal phase: Secondary | ICD-10-CM | POA: Insufficient documentation

## 2021-06-14 ENCOUNTER — Other Ambulatory Visit: Payer: Self-pay

## 2021-06-14 ENCOUNTER — Ambulatory Visit (INDEPENDENT_AMBULATORY_CARE_PROVIDER_SITE_OTHER): Payer: No Typology Code available for payment source | Admitting: Gastroenterology

## 2021-06-14 ENCOUNTER — Encounter: Payer: Self-pay | Admitting: Gastroenterology

## 2021-06-14 VITALS — BP 110/72 | HR 85 | Ht 62.5 in | Wt 176.1 lb

## 2021-06-14 DIAGNOSIS — R933 Abnormal findings on diagnostic imaging of other parts of digestive tract: Secondary | ICD-10-CM | POA: Diagnosis not present

## 2021-06-14 DIAGNOSIS — R1310 Dysphagia, unspecified: Secondary | ICD-10-CM | POA: Diagnosis not present

## 2021-06-14 DIAGNOSIS — R1013 Epigastric pain: Secondary | ICD-10-CM | POA: Diagnosis not present

## 2021-06-14 DIAGNOSIS — K219 Gastro-esophageal reflux disease without esophagitis: Secondary | ICD-10-CM | POA: Diagnosis not present

## 2021-06-14 NOTE — Progress Notes (Signed)
Chief Complaint: Dysphagia, GERD, epigastric pain  Referring Provider:     Samuel Bouche, NP   HPI:     Anita Bryant is a 40 y.o. female referred to the Gastroenterology Clinic for evaluation of dysphagia and odynophagia.  Symptoms started about 8 months ago, and progressively worsening.  Worse with cold drinks but also occurs with solids. Does not occur with warm ginger ale (only when it is cold; only drinks water and ginger ale at baseline). Has pain when feeling food is stuck, pointing to mid sternum.   Had trialed courses of pantoprazole, pepcid with no improvement.   Was seen by ENT for this issue on 04/18/2021.  Normal upper airway exam.  Recommended barium swallow. - MBS (05/16/2021): Normal oral/pharyngeal phase of swallow.  Pill travel through esophagus and appeared to remain in the LES accompanied by report of globus sensation from patient.  Pill eventually cleared with clear liquids.  Recommended GI referral.  Reports a hx of reflux. Index sxs of HB and belching. Sleeps with HOB elevated and avoids exacerbating foods. No improvement with trial of high dose PPI. No prior EGD.   Was seen by virtual appointment on 12/24/2018 for evaluation of epigastric pain.  Symptoms improved with Protonix 40 mg/day.  Evaluation otherwise largely unrevealing to include the following: - CT abdomen/pelvis (12/19/2018): left hepatic hypodensities measuring 13 x 11 mm and 6 x 8 mm, likely small cysts or hemangiomata.  Otherwise no duct dilatation, normal GB and pancreas, moderate stool retention but otherwise normal GI tract. - RUQ [place] (12/18/2018): normal GB no duct dilatation.  Fatty liver infiltration noted and 10 x 14 x 13 mm and 9 x 7 x 8 mm cysts noted in left liver, consistent with CT findings, with no additional masses. - Normal lipase, CBC, CMP - Recommended repeat ultrasound or MRI liver after 12 months to ensure size/appearance stability  Still gets intermittent episodes of  epigastric pain.  Unclear if this is related to her reported reflux symptoms. -CT abdomen/pelvis (01/21/2021): Unchanged 1.5 cm simple cyst in left hepatic lobe.  Otherwise unremarkable.   Past Medical History:  Diagnosis Date   Asthma    GERD (gastroesophageal reflux disease)    History of nephrolithiasis      Past Surgical History:  Procedure Laterality Date   ANKLE SURGERY Right    CARPAL TUNNEL WITH CUBITAL TUNNEL     KIDNEY STONE SURGERY     Family History  Problem Relation Age of Onset   Thyroid disease Mother    Cancer Father        MDS cancer, Swed's disease   Colon cancer Maternal Great-grandfather    Esophageal cancer Neg Hx    Rectal cancer Neg Hx    Liver disease Neg Hx    Pancreatic cancer Neg Hx    Social History   Tobacco Use   Smoking status: Never   Smokeless tobacco: Never  Vaping Use   Vaping Use: Never used  Substance Use Topics   Alcohol use: Never   Drug use: Never   Current Outpatient Medications  Medication Sig Dispense Refill   acetaminophen (TYLENOL) 325 MG tablet Take 650 mg by mouth every 4 (four) hours as needed.     albuterol (VENTOLIN HFA) 108 (90 Base) MCG/ACT inhaler Inhale 1-2 puffs into the lungs every 4 (four) hours as needed for wheezing or shortness of breath. 18 g 3   clobetasol cream (  TEMOVATE) 2.26 % Apply 1 application topically 2 (two) times daily as needed. 60 g 2   Prenatal Vit-Fe Fumarate-FA (PRENATAL ONE DAILY) 27-0.8 MG TABS Take 1 tablet by mouth daily. 90 tablet 3   No current facility-administered medications for this visit.   No Known Allergies   Review of Systems: All systems reviewed and negative except where noted in HPI.     Physical Exam:    Wt Readings from Last 3 Encounters:  06/14/21 176 lb 2 oz (79.9 kg)  01/21/21 171 lb 1.9 oz (77.6 kg)  12/28/20 170 lb (77.1 kg)    BP 110/72   Pulse 85   Ht 5' 2.5" (1.588 m)   Wt 176 lb 2 oz (79.9 kg)   SpO2 98%   BMI 31.70 kg/m  Constitutional:   Pleasant, in no acute distress. Psychiatric: Normal mood and affect. Behavior is normal. EENT: Pupils normal.  Conjunctivae are normal. No scleral icterus. Neck supple. No cervical LAD. Cardiovascular: Normal rate, regular rhythm. No edema Pulmonary/chest: Effort normal and breath sounds normal. No wheezing, rales or rhonchi. Abdominal: Mild TTP in MEG.  No rebound or guarding.  No peritoneal signs.  Soft, nondistended. Bowel sounds active throughout. There are no masses palpable. No hepatomegaly. Neurological: Alert and oriented to person place and time. Skin: Skin is warm and dry. No rashes noted.   ASSESSMENT AND PLAN;   1) Dysphagia 2) Epigastric pain 3) GERD 4) Abnormal Modified Barium Swallow  - EGD with dilation and bxs - If EGD unrevealing, plan for EM +/- pH/Mii - Advised patient to cut food into small pieces, eat small bites, chew food thoroughly and with plenty of liquids to avoid food impaction. - Holding off on trial of new medications as she did not respond to high-dose PPI or H2 RA previously  The indications, risks, and benefits of EGD were explained to the patient in detail. Risks include but are not limited to bleeding, perforation, adverse reaction to medications, and cardiopulmonary compromise. Sequelae include but are not limited to the possibility of surgery, hospitalization, and mortality. The patient verbalized understanding and wished to proceed. All questions answered, referred to scheduler. Further recommendations pending results of the exam.    [name], DO, FACG  06/14/2021, 9:36 AM   Samuel Bouche, NP

## 2021-06-14 NOTE — Patient Instructions (Signed)
If you are age 39 or older, your body mass index should be between 23-30. Your Body mass index is 31.7 kg/m. If this is out of the aforementioned range listed, please consider follow up with your Primary Care Provider.  If you are age 23 or younger, your body mass index should be between 19-25. Your Body mass index is 31.7 kg/m. If this is out of the aformentioned range listed, please consider follow up with your Primary Care Provider.   __________________________________________________________  The [facility] providers would like to encourage you to use [facility] to communicate with providers for non-urgent requests or questions.  Due to long hold times on the telephone, sending your provider a message by [facility] may be a faster and more efficient way to get a response.  Please allow 48 business hours for a response.  Please remember that this is for non-urgent requests.    Due to recent changes in healthcare laws, you may see the results of your imaging and laboratory studies on MyChart before your provider has had a chance to review them.  We understand that in some cases there may be results that are confusing or concerning to you. Not all laboratory results come back in the same time frame and the provider may be waiting for multiple results in order to interpret others.  Please give [place] 48 hours in order for your provider to thoroughly review all the results before contacting the office for clarification of your results.    Thank you for choosing me and [facility].  Vito Cirigliano, D.O.

## 2021-06-28 ENCOUNTER — Ambulatory Visit: Payer: No Typology Code available for payment source | Admitting: Family Medicine

## 2021-06-28 ENCOUNTER — Other Ambulatory Visit: Payer: Self-pay

## 2021-06-28 ENCOUNTER — Encounter: Payer: Self-pay | Admitting: Family Medicine

## 2021-06-28 VITALS — BP 123/81 | HR 74 | Temp 98.7°F | Wt 173.1 lb

## 2021-06-28 DIAGNOSIS — J4521 Mild intermittent asthma with (acute) exacerbation: Secondary | ICD-10-CM

## 2021-06-28 DIAGNOSIS — R21 Rash and other nonspecific skin eruption: Secondary | ICD-10-CM

## 2021-06-28 DIAGNOSIS — H6690 Otitis media, unspecified, unspecified ear: Secondary | ICD-10-CM | POA: Diagnosis not present

## 2021-06-28 MED ORDER — AMOXICILLIN-POT CLAVULANATE 875-125 MG PO TABS: 1.0000 | ORAL_TABLET | Freq: Two times a day (BID) | ORAL | 0 refills | Status: AC

## 2021-06-28 MED ORDER — PREDNISONE 10 MG (48) PO TBPK: ORAL_TABLET | Freq: Every day | ORAL | 0 refills | Status: DC

## 2021-06-28 NOTE — Progress Notes (Signed)
Acute Office Visit  Subjective:    Patient ID: Anita Bryant, female    DOB: 1981/04/08, 40 y.o.   MRN: [id number]  No chief complaint on file.   Otalgia  There is pain in the right ear. This is a new problem. The current episode started 1 to 4 weeks ago. The problem occurs constantly. The problem has been unchanged. There has been no fever. The pain is at a severity of 6/10 (up to 9/10 with palpation of ear, cannot lie on that side, painful using Qtip yesterday). The pain is moderate. Associated symptoms include headaches, neck pain, a rash and rhinorrhea. Pertinent negatives include no abdominal pain, coughing, diarrhea, ear discharge, hearing loss, sore throat or vomiting. She has tried nothing for the symptoms.    Shortness of breath - she has been having to use her albuterol at least twice a day for the past week and a half. States her mother-in-law was staying her and smokes. She was careful to smoke outside only, but still flared patient's asthma. No significant wheezing, mild at night.   Today, on her way to appointment she noticed a red rash to her upper chest. States it is mildly itching, but no other symptoms. States she does not feel hot/flushed and has not changed any soaps, detergents, etc recently. No edema, no trouble swallowing.    Past Medical History:  Diagnosis Date   Asthma    GERD (gastroesophageal reflux disease)    History of nephrolithiasis     Past Surgical History:  Procedure Laterality Date   ANKLE SURGERY Right    CARPAL TUNNEL WITH CUBITAL TUNNEL     KIDNEY STONE SURGERY      Family History  Problem Relation Age of Onset   Thyroid disease Mother    Cancer Father        MDS cancer, Swed's disease   Colon cancer Maternal Great-grandfather    Esophageal cancer Neg Hx    Rectal cancer Neg Hx    Liver disease Neg Hx    Pancreatic cancer Neg Hx     Social History   Socioeconomic History   Marital status: Married    Spouse name: Not on file    Number of children: Not on file   Years of education: Not on file   Highest education level: Not on file  Occupational History   Occupation: Cosmetologist  Tobacco Use   Smoking status: Never   Smokeless tobacco: Never  Vaping Use   Vaping Use: Never used  Substance and Sexual Activity   Alcohol use: Never   Drug use: Never   Sexual activity: Yes    Birth control/protection: None  Other Topics Concern   Not on file  Social History Narrative   Not on file   Social Determinants of Health   Financial Resource Strain: Not on file  Food Insecurity: Not on file  Transportation Needs: Not on file  Physical Activity: Not on file  Stress: Not on file  Social Connections: Not on file  Intimate Partner Violence: Not on file    Outpatient Medications Prior to Visit  Medication Sig Dispense Refill   acetaminophen (TYLENOL) 325 MG tablet Take 650 mg by mouth every 4 (four) hours as needed.     albuterol (VENTOLIN HFA) 108 (90 Base) MCG/ACT inhaler Inhale 1-2 puffs into the lungs every 4 (four) hours as needed for wheezing or shortness of breath. 18 g 3   clobetasol cream (TEMOVATE) 6.26 % Apply 1 application  topically 2 (two) times daily as needed. 60 g 2   Prenatal Vit-Fe Fumarate-FA (PRENATAL ONE DAILY) 27-0.8 MG TABS Take 1 tablet by mouth daily. 90 tablet 3   No facility-administered medications prior to visit.    No Known Allergies  Review of Symptoms: All review of systems negative except what is listed in the HPI     Objective:    Physical Exam Vitals reviewed.  Constitutional:      Appearance: Normal appearance.  HENT:     Right Ear: Hearing and ear canal normal. Tenderness present. Tympanic membrane is erythematous and bulging.     Left Ear: Hearing, tympanic membrane, ear canal and external ear normal.  Cardiovascular:     Rate and Rhythm: Normal rate and regular rhythm.     Heart sounds: Normal heart sounds.  Pulmonary:     Effort: Pulmonary effort is  normal.     Breath sounds: Normal breath sounds.  Musculoskeletal:     Cervical back: Normal range of motion and neck supple. No tenderness.  Lymphadenopathy:     Cervical: No cervical adenopathy.  Skin:    Findings: Erythema present.     Comments: Faint erythema to upper chest  Neurological:     Mental Status: She is alert and oriented to person, place, and time.  Psychiatric:        Mood and Affect: Mood normal.        Behavior: Behavior normal.        Thought Content: Thought content normal.        Judgment: Judgment normal.    There were no vitals taken for this visit. Wt Readings from Last 3 Encounters:  06/14/21 176 lb 2 oz (79.9 kg)  01/21/21 171 lb 1.9 oz (77.6 kg)  12/28/20 170 lb (77.1 kg)    Health Maintenance Due  Topic Date Due   PAP SMEAR-Modifier  12/24/2020   INFLUENZA VACCINE  04/25/2021    There are no preventive care reminders to display for this patient.   Lab Results  Component Value Date   TSH 5.51 (H) 12/28/2020   Lab Results  Component Value Date   WBC 6.2 01/21/2021   HGB 13.9 01/21/2021   HCT 42.2 01/21/2021   MCV 82.1 01/21/2021   PLT 364 01/21/2021   Lab Results  Component Value Date   NA 139 01/21/2021   K 4.4 01/21/2021   CO2 29 01/21/2021   GLUCOSE 90 01/21/2021   BUN 11 01/21/2021   CREATININE 0.79 01/21/2021   BILITOT 0.7 01/21/2021   ALKPHOS 75 11/07/2020   AST 22 01/21/2021   ALT 23 01/21/2021   PROT 7.7 01/21/2021   ALBUMIN 4.3 11/07/2020   CALCIUM 9.9 01/21/2021   ANIONGAP 11 11/07/2020   Lab Results  Component Value Date   CHOL 181 05/05/2020   Lab Results  Component Value Date   HDL 42 (L) 05/05/2020   Lab Results  Component Value Date   LDLCALC 102 (H) 05/05/2020   Lab Results  Component Value Date   TRIG 244 (H) 05/05/2020   Lab Results  Component Value Date   CHOLHDL 4.3 05/05/2020   No results found for: HGBA1C     Assessment & Plan:   1. Acute otitis media, unspecified otitis media  type R AOM. Treating with Augmentin. Can use warm compresses for comfort, tylenol/ibuprofen, etc. Patient aware of signs/symptoms requiring further/urgent evaluation. - amoxicillin-clavulanate (AUGMENTIN) 875-125 MG tablet; Take 1 tablet by mouth 2 (two) times daily for  10 days.  Dispense: 20 tablet; Refill: 0  2. Mild intermittent asthma with exacerbation Significant increase in dyspnea and albuterol use since being around MIL who smokes. Giving prednisone taper in order to give her a few extra days with prednisone onboard as she is about to be staying at her [place] and this always worsens her breathing. States she has plenty of albuterol. - predniSONE (STERAPRED UNI-PAK 48 TAB) 10 MG (48) TBPK tablet; Take by mouth daily. 12-Day taper, po  Dispense: 48 tablet; Refill: 0  3. Rash No clear borders and no raised/rough patches. Erythema started right before she came to appointment and seems to be fading now. Prednisone for asthma should help, but can use topical hydrocortisone cream OTC for itching if needed.   Patient aware of signs/symptoms requiring further/urgent evaluation.  Follow-up as needed.   Purcell Nails Olevia Bowens, DNP, FNP-C

## 2021-07-12 ENCOUNTER — Ambulatory Visit (INDEPENDENT_AMBULATORY_CARE_PROVIDER_SITE_OTHER): Payer: No Typology Code available for payment source | Admitting: Medical-Surgical

## 2021-07-12 ENCOUNTER — Other Ambulatory Visit: Payer: Self-pay

## 2021-07-12 DIAGNOSIS — Z23 Encounter for immunization: Secondary | ICD-10-CM

## 2021-07-12 NOTE — Progress Notes (Signed)
Pt here for influenza vaccine.  Screening questionnaire reviewed, VIS provided to patient, and any/all patient questions answered.  T. Whitfield Dulay, CMA  

## 2021-07-13 ENCOUNTER — Encounter: Payer: Self-pay | Admitting: Medical-Surgical

## 2021-08-09 ENCOUNTER — Encounter: Payer: Self-pay | Admitting: Gastroenterology

## 2021-08-09 ENCOUNTER — Ambulatory Visit (AMBULATORY_SURGERY_CENTER): Payer: No Typology Code available for payment source | Admitting: Gastroenterology

## 2021-08-09 ENCOUNTER — Other Ambulatory Visit: Payer: Self-pay

## 2021-08-09 VITALS — BP 100/70 | HR 73 | Temp 97.8°F | Resp 18 | Ht 62.5 in | Wt 176.0 lb

## 2021-08-09 DIAGNOSIS — K222 Esophageal obstruction: Secondary | ICD-10-CM | POA: Diagnosis not present

## 2021-08-09 DIAGNOSIS — K2980 Duodenitis without bleeding: Secondary | ICD-10-CM

## 2021-08-09 DIAGNOSIS — R1013 Epigastric pain: Secondary | ICD-10-CM

## 2021-08-09 DIAGNOSIS — R1310 Dysphagia, unspecified: Secondary | ICD-10-CM | POA: Diagnosis not present

## 2021-08-09 DIAGNOSIS — K317 Polyp of stomach and duodenum: Secondary | ICD-10-CM

## 2021-08-09 DIAGNOSIS — R933 Abnormal findings on diagnostic imaging of other parts of digestive tract: Secondary | ICD-10-CM

## 2021-08-09 DIAGNOSIS — K2100 Gastro-esophageal reflux disease with esophagitis, without bleeding: Secondary | ICD-10-CM

## 2021-08-09 DIAGNOSIS — K449 Diaphragmatic hernia without obstruction or gangrene: Secondary | ICD-10-CM

## 2021-08-09 DIAGNOSIS — D130 Benign neoplasm of esophagus: Secondary | ICD-10-CM

## 2021-08-09 DIAGNOSIS — K3189 Other diseases of stomach and duodenum: Secondary | ICD-10-CM

## 2021-08-09 MED ORDER — ESOMEPRAZOLE MAGNESIUM 40 MG PO CPDR: 40.0000 mg | DELAYED_RELEASE_CAPSULE | Freq: Two times a day (BID) | ORAL | 2 refills | Status: DC

## 2021-08-09 MED ORDER — SODIUM CHLORIDE 0.9 % IV SOLN: 500.0000 mL | Freq: Once | INTRAVENOUS | Status: DC

## 2021-08-09 NOTE — Progress Notes (Signed)
Called to room to assist during endoscopic procedure.  Patient ID and intended procedure confirmed with present staff. Received instructions for my participation in the procedure from the performing physician.  

## 2021-08-09 NOTE — Patient Instructions (Signed)
Thank you for letting us take care of your healthcare needs.  Please review handouts post dilation diet and hiatal hernia.  YOU HAD AN ENDOSCOPIC PROCEDURE TODAY AT [facility]:   Refer to the procedure report that was given to you for any specific questions about what was found during the examination.  If the procedure report does not answer your questions, please call your gastroenterologist to clarify.  If you requested that your care partner not be given the details of your procedure findings, then the procedure report has been included in a sealed envelope for you to review at your convenience later.  YOU SHOULD EXPECT: Some feelings of bloating in the abdomen. Passage of more gas than usual.  Walking can help get rid of the air that was put into your GI tract during the procedure and reduce the bloating. If you had a lower endoscopy (such as a colonoscopy or flexible sigmoidoscopy) you may notice spotting of blood in your stool or on the toilet paper. If you underwent a bowel prep for your procedure, you may not have a normal bowel movement for a few days.  Please Note:  You might notice some irritation and congestion in your nose or some drainage.  This is from the oxygen used during your procedure.  There is no need for concern and it should clear up in a day or so.  SYMPTOMS TO REPORT IMMEDIATELY:   Following upper endoscopy (EGD)  Vomiting of blood or coffee ground material  New chest pain or pain under the shoulder blades  Painful or persistently difficult swallowing  New shortness of breath  Fever of 100F or higher  Black, tarry-looking stools  For urgent or emergent issues, a gastroenterologist can be reached at any hour by calling [phone]. Do not use MyChart messaging for urgent concerns.    DIET:  Clear liquid to start 0930 then soft diet at 1030 for rest of the day.  Tomorrow  you may proceed to your regular diet.  Drink plenty of fluids but you should  avoid alcoholic beverages for 24 hours.  ACTIVITY:  You should plan to take it easy for the rest of today and you should NOT DRIVE or use heavy machinery until tomorrow (because of the sedation medicines used during the test).    FOLLOW UP: Our staff will call the number listed on your records 48-72 hours following your procedure to check on you and address any questions or concerns that you may have regarding the information given to you following your procedure. If we do not reach you, we will leave a message.  We will attempt to reach you two times.  During this call, we will ask if you have developed any symptoms of COVID 19. If you develop any symptoms (ie: fever, flu-like symptoms, shortness of breath, cough etc.) before then, please call [phone].  If you test positive for Covid 19 in the 2 weeks post procedure, please call and report this information to [place].    If any biopsies were taken you will be contacted by phone or by letter within the next 1-3 weeks.  Please call us at [phone] if you have not heard about the biopsies in 3 weeks.    SIGNATURES/CONFIDENTIALITY: You and/or your care partner have signed paperwork which will be entered into your electronic medical record.  These signatures attest to the fact that that the information above on your After Visit Summary has been reviewed and is understood.  Full responsibility of the confidentiality of this discharge information lies with you and/or your care-partner.

## 2021-08-09 NOTE — Op Note (Signed)
[facility] Patient Name: Anita Bryant Procedure Date: 08/09/2021 7:48 AM MRN: [id number] Endoscopist: Gerrit Heck , MD Age: 40 Referring MD:  Date of Birth: 09/10/1981 Gender: Female Account #: [id number] Procedure:                Upper GI endoscopy Indications:              Epigastric abdominal pain, Dysphagia, Odynophagia,                            Suspected esophageal reflux, Abnormal                            cine-esophagram Medicines:                Monitored Anesthesia Care Procedure:                Pre-Anesthesia Assessment:                           - Prior to the procedure, a History and Physical                            was performed, and patient medications and                            allergies were reviewed. The patient's tolerance of                            previous anesthesia was also reviewed. The risks                            and benefits of the procedure and the sedation                            options and risks were discussed with the patient.                            All questions were answered, and informed consent                            was obtained. Prior Anticoagulants: The patient has                            taken no previous anticoagulant or antiplatelet                            agents. ASA Grade Assessment: II - A patient with                            mild systemic disease. After reviewing the risks                            and benefits, the patient was deemed in  satisfactory condition to undergo the procedure.                           After obtaining informed consent, the endoscope was                            passed under direct vision. Throughout the                            procedure, the patient's blood pressure, pulse, and                            oxygen saturations were monitored continuously. The                            GIF [id number] #[id number] was introduced through the                             mouth, and advanced to the second part of duodenum.                            The upper GI endoscopy was accomplished without                            difficulty. The patient tolerated the procedure                            well. Scope In: Scope Out: Findings:                 One benign-appearing, intrinsic moderate stenosis                            was found 33 cm from the incisors. This stenosis                            measured 1 cm (in length). The stenosis was                            traversed. A TTS dilator was passed through the                            scope. Dilation with a 16-17-18 mm balloon dilator                            was performed to 16 mm. The dilation site was                            examined and showed mild mucosal disruption and                            moderate improvement in luminal narrowing. This was  then biopsied with a cold forceps for further                            fracturing of the stricture. Estimated blood loss                            was minimal.                           LA Grade B (one or more mucosal breaks greater than                            5 mm, not extending between the tops of two mucosal                            folds) esophagitis with no bleeding was found in                            the lower third of the esophagus. There was                            felinization noted in the distal esophagus as well.                           Two polyps with no bleeding, ranging 3-5 mm in                            size, were found 31 cm from the incisors. These                            were biopsied with a cold forceps for histology.                            Estimated blood loss was minimal.                           The upper third of the esophagus and middle third                            of the esophagus were otherwise normal. Additional                             biopsies were obtained from the proximal and distal                            esophagus with cold forceps for histology of                            suspected eosinophilic esophagitis. Estimated blood                            loss was minimal.  A 2 cm hiatal hernia was present.                           The gastroesophageal flap valve was visualized                            endoscopically and classified as Hill Grade III                            (minimal fold, loose to endoscope, hiatal hernia                            likely).                           The entire examined stomach was normal. Biopsies                            were taken with a cold forceps for Helicobacter                            pylori testing. Estimated blood loss was minimal.                           The examined duodenum was normal. Biopsies were                            taken with a cold forceps for histology. Estimated                            blood loss was minimal. Complications:            No immediate complications. Estimated Blood Loss:     Estimated blood loss was minimal. Impression:               - Benign-appearing esophageal stenosis. Dilated to                            16 mm TTS dilator with appropriate mucosal rent.                            This was then further fractured using cold forceps.                           - LA Grade B reflux esophagitis with no bleeding.                           - Esophageal polyp(s) were found. Biopsied.                           - Normal upper third of esophagus and middle third                            of esophagus. Biopsied.                           -  2 cm hiatal hernia.                           - Gastroesophageal flap valve classified as Hill                            Grade III (minimal fold, loose to endoscope, hiatal                            hernia likely).                           - Normal stomach. Biopsied.                            - Normal examined duodenum. Biopsied. Recommendation:           - Patient has a contact number available for                            emergencies. The signs and symptoms of potential                            delayed complications were discussed with the                            patient. Return to normal activities tomorrow.                            Written discharge instructions were provided to the                            patient.                           - Resume previous diet.                           - Continue present medications.                           - Await pathology results.                           - Use Nexium (esomeprazole) 40 mg PO BID for 6                            weeks, then reduce to 40 mg daily until follow-up                            appointment.                           - Return to GI clinic in 2-3 months.                           - Repeat upper endoscopy in 8  weeks to check                            healing.                           - If no clinical improvement, will plan for                            Esophageal Manometry and pH/Impedance testing off                            PPI. Gerrit Heck, MD 08/09/2021 8:31:25 AM

## 2021-08-09 NOTE — Progress Notes (Signed)
Pt in recovery with monitors in place, VSS. Report given to receiving RN. Bite guard was placed with pt awake to ensure comfort. No dental or soft tissue damage noted. 

## 2021-08-09 NOTE — Progress Notes (Signed)
GASTROENTEROLOGY PROCEDURE H&P NOTE   Primary Care Physician: Samuel Bouche, NP    Reason for Procedure:   Dysphagia, odynophagia, MEG pain, reflux sxs  Plan:    EGD with possible dilation and bxs  Patient is appropriate for endoscopic procedure(s) in the ambulatory ([facility]) setting.  The nature of the procedure, as well as the risks, benefits, and alternatives were carefully and thoroughly reviewed with the patient. Ample time for discussion and questions allowed. The patient understood, was satisfied, and agreed to proceed.     HPI: Anita Bryant is a 40 y.o. female who presents for EGD for evaluation and possible treatment of dysphagia, odynophagia, intermittent epigastric pain, and reflux sxs.   Past Medical History:  Diagnosis Date   Asthma    GERD (gastroesophageal reflux disease)    History of nephrolithiasis     Past Surgical History:  Procedure Laterality Date   ANKLE SURGERY Right    CARPAL TUNNEL WITH CUBITAL TUNNEL     KIDNEY STONE SURGERY      Prior to Admission medications   Medication Sig Start Date End Date Taking? Authorizing Provider  acetaminophen (TYLENOL) 325 MG tablet Take 650 mg by mouth every 4 (four) hours as needed.    [provider]  albuterol (VENTOLIN HFA) 108 (90 Base) MCG/ACT inhaler Inhale 1-2 puffs into the lungs every 4 (four) hours as needed for wheezing or shortness of breath. 05/10/20   Samuel Bouche, NP  clobetasol cream (TEMOVATE) 3.23 % Apply 1 application topically 2 (two) times daily as needed. 05/10/20   Samuel Bouche, NP  Prenatal Vit-Fe Fumarate-FA (PRENATAL ONE DAILY) 27-0.8 MG TABS Take 1 tablet by mouth daily. 10/30/18   Trixie Dredge, PA-C    Current Outpatient Medications  Medication Sig Dispense Refill   acetaminophen (TYLENOL) 325 MG tablet Take 650 mg by mouth every 4 (four) hours as needed.     albuterol (VENTOLIN HFA) 108 (90 Base) MCG/ACT inhaler Inhale 1-2 puffs into the lungs every 4 (four) hours  as needed for wheezing or shortness of breath. 18 g 3   clobetasol cream (TEMOVATE) 5.57 % Apply 1 application topically 2 (two) times daily as needed. 60 g 2   Prenatal Vit-Fe Fumarate-FA (PRENATAL ONE DAILY) 27-0.8 MG TABS Take 1 tablet by mouth daily. 90 tablet 3   Current Facility-Administered Medications  Medication Dose Route Frequency Provider Last Rate Last Admin   0.9 %  sodium chloride infusion  500 mL Intravenous Once Zakari Bathe V, DO        Allergies as of 08/09/2021 - Review Complete 08/09/2021  Allergen Reaction Noted   Cefdinir Nausea And Vomiting 06/28/2021   Decadron [dexamethasone] Nausea And Vomiting 06/28/2021    Family History  Problem Relation Age of Onset   Thyroid disease Mother    Cancer Father        MDS cancer, Swed's disease   Colon cancer Maternal Great-grandfather    Esophageal cancer Neg Hx    Rectal cancer Neg Hx    Liver disease Neg Hx    Pancreatic cancer Neg Hx     Social History   Socioeconomic History   Marital status: Married    Spouse name: Not on file   Number of children: Not on file   Years of education: Not on file   Highest education level: Not on file  Occupational History   Occupation: Cosmetologist  Tobacco Use   Smoking status: Never   Smokeless tobacco: Never  Vaping Use  Vaping Use: Never used  Substance and Sexual Activity   Alcohol use: Never   Drug use: Never   Sexual activity: Yes    Birth control/protection: None  Other Topics Concern   Not on file  Social History Narrative   Not on file   Social Determinants of Health   Financial Resource Strain: Not on file  Food Insecurity: Not on file  Transportation Needs: Not on file  Physical Activity: Not on file  Stress: Not on file  Social Connections: Not on file  Intimate Partner Violence: Not on file    Physical Exam: Vital signs in last 24 hours: @BP  138/87 (BP Location: Right Arm, Patient Position: Sitting, Cuff Size: Normal)   Pulse 76    Temp 97.8 F (36.6 C) (Temporal)   Ht 5' 2.5" (1.588 m)   Wt 176 lb (79.8 kg)   LMP 08/09/2021   SpO2 100%   BMI 31.68 kg/m  GEN: NAD EYE: Sclerae anicteric ENT: MMM CV: Non-tachycardic Pulm: CTA b/l GI: Soft, NT/ND NEURO:  Alert & Oriented x 3   Gerrit Heck, DO [facility]   08/09/2021 7:46 AM

## 2021-08-09 NOTE — Progress Notes (Signed)
VitPt's states no medical or surgical changes since previsit or office visit. als-CW

## 2021-08-11 ENCOUNTER — Telehealth: Payer: Self-pay

## 2021-08-11 NOTE — Telephone Encounter (Signed)
  Follow up Call-  Call back number 08/09/2021  Post procedure Call Back phone  # [phone]  Permission to leave phone message Yes  Some recent data might be hidden     Patient questions:  Do you have a fever, pain , or abdominal swelling? No. Pain Score  0 *  Have you tolerated food without any problems? Yes.    Have you been able to return to your normal activities? Yes.    Do you have any questions about your discharge instructions: Diet   No. Medications  No. Follow up visit  No.  Do you have questions or concerns about your Care? No.  Actions: * If pain score is 4 or above: No action needed, pain <4. Have you developed a fever since your procedure? no  2.   Have you had an respiratory symptoms (SOB or cough) since your procedure? no  3.   Have you tested positive for COVID 19 since your procedure no  4.   Have you had any family members/close contacts diagnosed with the COVID 19 since your procedure?  no   If yes to any of these questions please route to Joylene John, RN and Joella Prince, RN

## 2021-08-26 ENCOUNTER — Encounter: Payer: Self-pay | Admitting: Gastroenterology

## 2021-08-31 ENCOUNTER — Encounter: Payer: Self-pay | Admitting: Gastroenterology

## 2021-08-31 DIAGNOSIS — K2100 Gastro-esophageal reflux disease with esophagitis, without bleeding: Secondary | ICD-10-CM

## 2021-08-31 DIAGNOSIS — K449 Diaphragmatic hernia without obstruction or gangrene: Secondary | ICD-10-CM

## 2021-08-31 DIAGNOSIS — K222 Esophageal obstruction: Secondary | ICD-10-CM

## 2021-08-31 DIAGNOSIS — R1310 Dysphagia, unspecified: Secondary | ICD-10-CM

## 2021-08-31 NOTE — Telephone Encounter (Signed)
Pt has been scheduled for repeat EGD on Monday, 10/18/20 at 10 am. Pt will need to arrive with a care partner by 9 am. Instructions sent to patient via my chart.   Ambulatory referral to GI in epic.

## 2021-09-27 ENCOUNTER — Other Ambulatory Visit: Payer: Self-pay

## 2021-09-27 ENCOUNTER — Ambulatory Visit (INDEPENDENT_AMBULATORY_CARE_PROVIDER_SITE_OTHER): Payer: No Typology Code available for payment source | Admitting: Gastroenterology

## 2021-09-27 ENCOUNTER — Encounter: Payer: Self-pay | Admitting: Gastroenterology

## 2021-09-27 VITALS — BP 118/82 | HR 91 | Ht 61.0 in | Wt 178.1 lb

## 2021-09-27 DIAGNOSIS — R1013 Epigastric pain: Secondary | ICD-10-CM | POA: Diagnosis not present

## 2021-09-27 DIAGNOSIS — K222 Esophageal obstruction: Secondary | ICD-10-CM

## 2021-09-27 DIAGNOSIS — K2100 Gastro-esophageal reflux disease with esophagitis, without bleeding: Secondary | ICD-10-CM

## 2021-09-27 NOTE — Progress Notes (Signed)
Chief Complaint:    Epigastric pain  GI History: 41 year old female with no significant medical history, initially seen in the GI clinic on 06/14/2021 for evaluation of progressive dysphagia and odynophagia x8 months, pointing to mid sternum.  Separately with a history of reflux symptoms. Index sxs of HB and belching. Sleeps with HOB elevated and avoids exacerbating foods. No improvement with trial of high dose PPI.   - Trialed course of pantoprazole and Pepcid without improvement - 04/18/2021: Evaluated by ENT.  Normal upper airway exam - 05/16/2021: MBS: Normal oral/pharyngeal phase of swallow.  Pill travel through esophagus and appeared to remain in the LES accompanied by report of globus sensation from patient.  Pill eventually cleared with clear liquids.  Recommended GI referral.   Additionally, history of epigastric pain for which she was seen 11/2018.  Epigastric pain initially improved with Protonix 40 mg/day, but less efficacious over time. - CT abdomen/pelvis (12/19/2018): left hepatic hypodensities measuring 13 x 11 mm and 6 x 8 mm, likely small cysts or hemangiomata.  Otherwise no duct dilatation, normal GB and pancreas, moderate stool retention but otherwise normal GI tract. - RUQ [place] (12/18/2018): normal GB no duct dilatation.  Fatty liver infiltration noted and 10 x 14 x 13 mm and 9 x 7 x 8 mm cysts noted in left liver, consistent with CT findings, with no additional masses. - Normal lipase, CBC, CMP - Recommended repeat ultrasound or MRI liver after 12 months to ensure size/appearance stability -CT abdomen/pelvis (01/21/2021): Unchanged 1.5 cm simple cyst in left hepatic lobe.  Otherwise unremarkable. -05/2021: Follow-up appointment in the GI clinic.  Ongoing intermittent MEG pain. - 07/2021: EGD: Stenosis at 33 cm dilated with 16 mm TTS balloon then fractured with forceps, LA Grade B esophagitis, 2 benign esophageal polyps.  Esophageal biopsies negative for EOE.  2 cm HH.  Normal  stomach/duodenum (benign path from both).  Treated with Nexium 40 mg twice daily x6 weeks with recommended repeat upper endoscopy at 8 weeks   HPI:     Patient is a 41 y.o. female presenting to the Gastroenterology Clinic for follow-up.  Last seen by me in 05/2021 for evaluation of dysphagia, odynophagia, and ongoing intermittent MEG pain with occasional reflux symptoms.  EGD completed in 07/2021 and notable for Stenosis at 33 cm dilated with 16 mm TTS balloon then fractured with forceps, LA Grade B esophagitis, 2 benign esophageal polyps.  Esophageal biopsies negative for EOE.  2 cm HH.  Normal stomach/duodenum (benign path from both).  Treated with Nexium 40 mg twice daily x6 weeks with recommended repeat upper endoscopy at 8 weeks.   Today, she states dysphagia resolved with EGD as above.  Reflux symptoms well controlled since starting Nexium high-dose.    Main issue today is post prandial MEG pain. Typically within 1 hour of eating, lasting 30+ mins. No n/v. Not much change with high dose Nexium. Independent of food types.    Review of systems:     No chest pain, no SOB, no fevers, no urinary sx   Past Medical History:  Diagnosis Date   Asthma    GERD (gastroesophageal reflux disease)    History of nephrolithiasis     Patient's surgical history, family medical history, social history, medications and allergies were all reviewed in Epic    Current Outpatient Medications  Medication Sig Dispense Refill   acetaminophen (TYLENOL) 325 MG tablet Take 650 mg by mouth every 4 (four) hours as needed.     albuterol (  VENTOLIN HFA) 108 (90 Base) MCG/ACT inhaler Inhale 1-2 puffs into the lungs every 4 (four) hours as needed for wheezing or shortness of breath. 18 g 3   clobetasol cream (TEMOVATE) 2.69 % Apply 1 application topically 2 (two) times daily as needed. 60 g 2   esomeprazole (NEXIUM) 40 MG capsule Take 1 capsule (40 mg total) by mouth 2 (two) times daily before a meal. 40 mg BID for 6  weeks then reduce to 40 mg daily. 60 capsule 2   Prenatal Vit-Fe Fumarate-FA (PRENATAL ONE DAILY) 27-0.8 MG TABS Take 1 tablet by mouth daily. 90 tablet 3   No current facility-administered medications for this visit.    Physical Exam:     BP 118/82    Pulse 91    Ht 5\' 1"  (1.549 m)    Wt 178 lb 2 oz (80.8 kg)    BMI 33.66 kg/m   GENERAL:  Pleasant female in NAD PSYCH: : Cooperative, normal affect Musculoskeletal:  Normal muscle tone, normal strength NEURO: Alert and oriented x 3, no focal neurologic deficits   IMPRESSION and PLAN:    1) Esophageal stricture 2) GERD with erosive esophagitis - Continue Nexium as prescribed - Continue antireflux lifestyle/dietary modifications - Repeat EGD later this month to evaluate for appropriate mucosal healing and repeat dilation if needed  3) Epigastric pain - EGD otherwise largely unrevealing.  Has otherwise had extensive work-up to include CT x2, RUQ [place], and normal labs - HIDA scan - Trial course of FD guard  The indications, risks, and benefits of repeat EGD were explained to the patient in detail. Risks include but are not limited to bleeding, perforation, adverse reaction to medications, and cardiopulmonary compromise. Sequelae include but are not limited to the possibility of surgery, hospitalization, and mortality. The patient verbalized understanding and wished to proceed. All questions answered, referred to scheduler. Further recommendations pending results of the exam.    Anita Bryant ,DO, FACG 09/27/2021, 1:57 PM

## 2021-09-27 NOTE — Patient Instructions (Signed)
If you are age 40 or younger, your body mass index should be between 19-25. Your Body mass index is 33.66 kg/m. If this is out of the aformentioned range listed, please consider follow up with your Primary Care Provider.   __________________________________________________________  The [place] [facility] providers would like to encourage you to use [facility] to communicate with providers for non-urgent requests or questions.  Due to long hold times on the telephone, sending your provider a message by [facility] may be a faster and more efficient way to get a response.  Please allow 48 business hours for a response.  Please remember that this is for non-urgent requests.    Due to recent changes in healthcare laws, you may see the results of your imaging and laboratory studies on MyChart before your provider has had a chance to review them.  We understand that in some cases there may be results that are confusing or concerning to you. Not all laboratory results come back in the same time frame and the provider may be waiting for multiple results in order to interpret others.  Please give [place] 48 hours in order for your provider to thoroughly review all the results before contacting the office for clarification of your results.   Please purchase the following medications over the counter and take as directed:  FD Guard  You have been scheduled for a HIDA scan at [facility] (1st floor) on 10/07/2021. Please arrive 30 minutes prior to your scheduled appointment at  9.30. Make certain not to have anything to eat or drink at least 6 hours prior to your test. Should this appointment date or time not work well for you, please call radiology scheduling at [phone].  _____________________________________________________________________ hepatobiliary (HIDA) scan is an imaging procedure used to diagnose problems in the liver, gallbladder and bile ducts. In the HIDA scan, a radioactive chemical or tracer is  injected into a vein in your arm. The tracer is handled by the liver like bile. Bile is a fluid produced and excreted by your liver that helps your digestive system break down fats in the foods you eat. Bile is stored in your gallbladder and the gallbladder releases the bile when you eat a meal. A special nuclear medicine scanner (gamma camera) tracks the flow of the tracer from your liver into your gallbladder and small intestine.  During your HIDA scan  You'll be asked to change into a hospital gown before your HIDA scan begins. Your health care team will position you on a table, usually on your back. The radioactive tracer is then injected into a vein in your arm.The tracer travels through your bloodstream to your liver, where it's taken up by the bile-producing cells. The radioactive tracer travels with the bile from your liver into your gallbladder and through your bile ducts to your small intestine.You may feel some pressure while the radioactive tracer is injected into your vein. As you lie on the table, a special gamma camera is positioned over your abdomen taking pictures of the tracer as it moves through your body. The gamma camera takes pictures continually for about an hour. You'll need to keep still during the HIDA scan. This can become uncomfortable, but you may find that you can lessen the discomfort by taking deep breaths and thinking about other things. Tell your health care team if you're uncomfortable. The radiologist will watch on a computer the progress of the radioactive tracer through your body. The HIDA scan may be stopped when the  radioactive tracer is seen in the gallbladder and enters your small intestine. This typically takes about an hour. In some cases extra imaging will be performed if original images aren't satisfactory, if morphine is given to help visualize the gallbladder or if the medication CCK is given to look at the contraction of the gallbladder. This test typically takes 2  hours to complete. ________________________________________________________________________      Thank you for choosing me and [facility].  Vito Cirigliano, D.O.

## 2021-10-07 ENCOUNTER — Ambulatory Visit (HOSPITAL_COMMUNITY): Payer: No Typology Code available for payment source

## 2021-10-18 ENCOUNTER — Encounter: Payer: Self-pay | Admitting: Gastroenterology

## 2021-10-18 ENCOUNTER — Other Ambulatory Visit: Payer: Self-pay

## 2021-10-18 ENCOUNTER — Ambulatory Visit (AMBULATORY_SURGERY_CENTER): Payer: No Typology Code available for payment source | Admitting: Gastroenterology

## 2021-10-18 VITALS — BP 113/74 | HR 76 | Temp 97.8°F | Resp 13 | Ht 61.0 in | Wt 178.0 lb

## 2021-10-18 DIAGNOSIS — R1013 Epigastric pain: Secondary | ICD-10-CM

## 2021-10-18 DIAGNOSIS — K2281 Esophageal polyp: Secondary | ICD-10-CM

## 2021-10-18 DIAGNOSIS — K449 Diaphragmatic hernia without obstruction or gangrene: Secondary | ICD-10-CM | POA: Diagnosis not present

## 2021-10-18 DIAGNOSIS — K219 Gastro-esophageal reflux disease without esophagitis: Secondary | ICD-10-CM

## 2021-10-18 DIAGNOSIS — K222 Esophageal obstruction: Secondary | ICD-10-CM

## 2021-10-18 DIAGNOSIS — R12 Heartburn: Secondary | ICD-10-CM

## 2021-10-18 DIAGNOSIS — R1310 Dysphagia, unspecified: Secondary | ICD-10-CM

## 2021-10-18 DIAGNOSIS — K3189 Other diseases of stomach and duodenum: Secondary | ICD-10-CM | POA: Diagnosis not present

## 2021-10-18 DIAGNOSIS — K2100 Gastro-esophageal reflux disease with esophagitis, without bleeding: Secondary | ICD-10-CM | POA: Diagnosis not present

## 2021-10-18 DIAGNOSIS — K2289 Other specified disease of esophagus: Secondary | ICD-10-CM | POA: Diagnosis not present

## 2021-10-18 MED ORDER — SODIUM CHLORIDE 0.9 % IV SOLN: 500.0000 mL | Freq: Once | INTRAVENOUS | Status: DC

## 2021-10-18 NOTE — Progress Notes (Signed)
GASTROENTEROLOGY PROCEDURE H&P NOTE   Primary Care Physician: Samuel Bouche, NP    Reason for Procedure:   Epigastric pain, GERD, Hx of esophageal stricture   Plan:    EGD with possible dilation and biopsies  Patient is appropriate for endoscopic procedure(s) in the ambulatory ([place]) setting.  The nature of the procedure, as well as the risks, benefits, and alternatives were carefully and thoroughly reviewed with the patient. Ample time for discussion and questions allowed. The patient understood, was satisfied, and agreed to proceed.     HPI: Anita Bryant is a 41 y.o. female who presents for EGD for evaluation of MEG pain along with f/u of GERD with erosive esophagitis and peptic stricture on prior EGD.  Patient was most recently seen in the Gastroenterology Clinic on 09/27/2021 by me.  No interval change in medical history since that appointment. Please refer to that note for full details regarding GI history and clinical presentation.   Past Medical History:  Diagnosis Date   Asthma    GERD (gastroesophageal reflux disease)    Hashimoto's disease    History of nephrolithiasis     Past Surgical History:  Procedure Laterality Date   ANKLE SURGERY Right    CARPAL TUNNEL WITH CUBITAL TUNNEL     KIDNEY STONE SURGERY     UPPER GASTROINTESTINAL ENDOSCOPY      Prior to Admission medications   Medication Sig Start Date End Date Taking? Authorizing Provider  esomeprazole (NEXIUM) 40 MG capsule Take 1 capsule (40 mg total) by mouth 2 (two) times daily before a meal. 40 mg BID for 6 weeks then reduce to 40 mg daily. 08/09/21  Yes Taliya Mcclard V, DO  Prenatal Vit-Fe Fumarate-FA (PRENATAL ONE DAILY) 27-0.8 MG TABS Take 1 tablet by mouth daily. 10/30/18  Yes Trixie Dredge, PA-C  acetaminophen (TYLENOL) 325 MG tablet Take 650 mg by mouth every 4 (four) hours as needed. Patient not taking: Reported on 10/18/2021    [provider]  albuterol (VENTOLIN HFA) 108  (90 Base) MCG/ACT inhaler Inhale 1-2 puffs into the lungs every 4 (four) hours as needed for wheezing or shortness of breath. 05/10/20   Samuel Bouche, NP  clobetasol cream (TEMOVATE) 1.61 % Apply 1 application topically 2 (two) times daily as needed. Patient not taking: Reported on 10/18/2021 05/10/20   Samuel Bouche, NP    Current Outpatient Medications  Medication Sig Dispense Refill   esomeprazole (NEXIUM) 40 MG capsule Take 1 capsule (40 mg total) by mouth 2 (two) times daily before a meal. 40 mg BID for 6 weeks then reduce to 40 mg daily. 60 capsule 2   Prenatal Vit-Fe Fumarate-FA (PRENATAL ONE DAILY) 27-0.8 MG TABS Take 1 tablet by mouth daily. 90 tablet 3   acetaminophen (TYLENOL) 325 MG tablet Take 650 mg by mouth every 4 (four) hours as needed. (Patient not taking: Reported on 10/18/2021)     albuterol (VENTOLIN HFA) 108 (90 Base) MCG/ACT inhaler Inhale 1-2 puffs into the lungs every 4 (four) hours as needed for wheezing or shortness of breath. 18 g 3   clobetasol cream (TEMOVATE) 0.96 % Apply 1 application topically 2 (two) times daily as needed. (Patient not taking: Reported on 10/18/2021) 60 g 2   Current Facility-Administered Medications  Medication Dose Route Frequency Provider Last Rate Last Admin   0.9 %  sodium chloride infusion  500 mL Intravenous Once Michaeleen Down V, DO        Allergies as of 10/18/2021 -  Review Complete 10/18/2021  Allergen Reaction Noted   Cefdinir Nausea And Vomiting 06/28/2021   Decadron [dexamethasone] Nausea And Vomiting 06/28/2021    Family History  Problem Relation Age of Onset   Thyroid disease Mother    Cancer Father        MDS cancer, Swed's disease   Colon cancer Maternal Great-grandfather    Esophageal cancer Neg Hx    Rectal cancer Neg Hx    Liver disease Neg Hx    Pancreatic cancer Neg Hx     Social History   Socioeconomic History   Marital status: Married    Spouse name: Not on file   Number of children: Not on file   Years of  education: Not on file   Highest education level: Not on file  Occupational History   Occupation: Cosmetologist  Tobacco Use   Smoking status: Never   Smokeless tobacco: Never  Vaping Use   Vaping Use: Never used  Substance and Sexual Activity   Alcohol use: Never   Drug use: Never   Sexual activity: Yes    Birth control/protection: None  Other Topics Concern   Not on file  Social History Narrative   Not on file   Social Determinants of Health   Financial Resource Strain: Not on file  Food Insecurity: Not on file  Transportation Needs: Not on file  Physical Activity: Not on file  Stress: Not on file  Social Connections: Not on file  Intimate Partner Violence: Not on file    Physical Exam: Vital signs in last 24 hours: @BP  123/72    Pulse 84    Temp 97.8 F (36.6 C)    Ht 5\' 1"  (1.549 m)    Wt 178 lb (80.7 kg)    LMP 09/23/2021 Comment: no chance of pregnancy per pt   SpO2 100%    BMI 33.63 kg/m  GEN: NAD EYE: Sclerae anicteric ENT: MMM CV: Non-tachycardic Pulm: CTA b/l GI: Soft, NT/ND NEURO:  Alert & Oriented x 3   Gerrit Heck, DO [facility]   10/18/2021 9:40 AM

## 2021-10-18 NOTE — Patient Instructions (Signed)
YOU HAD AN ENDOSCOPIC PROCEDURE TODAY AT THE [facility]:   Refer to the procedure report that was given to you for any specific questions about what was found during the examination.  If the procedure report does not answer your questions, please call your gastroenterologist to clarify.  If you requested that your care partner not be given the details of your procedure findings, then the procedure report has been included in a sealed envelope for you to review at your convenience later.  YOU SHOULD EXPECT: Some feelings of bloating in the abdomen. Passage of more gas than usual.  Walking can help get rid of the air that was put into your GI tract during the procedure and reduce the bloating. If you had a lower endoscopy (such as a colonoscopy or flexible sigmoidoscopy) you may notice spotting of blood in your stool or on the toilet paper. If you underwent a bowel prep for your procedure, you may not have a normal bowel movement for a few days.  Please Note:  You might notice some irritation and congestion in your nose or some drainage.  This is from the oxygen used during your procedure.  There is no need for concern and it should clear up in a day or so.  SYMPTOMS TO REPORT IMMEDIATELY:    Following upper endoscopy (EGD)  Vomiting of blood or coffee ground material  New chest pain or pain under the shoulder blades  Painful or persistently difficult swallowing  New shortness of breath  Fever of 100F or higher  Black, tarry-looking stools  For urgent or emergent issues, a gastroenterologist can be reached at any hour by calling [phone]. Do not use MyChart messaging for urgent concerns.    DIET:  We do recommend a small meal at first, but then you may proceed to your regular diet.  Drink plenty of fluids but you should avoid alcoholic beverages for 24 hours.  ACTIVITY:  You should plan to take it easy for the rest of today and you should NOT DRIVE or use heavy machinery  until tomorrow (because of the sedation medicines used during the test).    FOLLOW UP: Our staff will call the number listed on your records 48-72 hours following your procedure to check on you and address any questions or concerns that you may have regarding the information given to you following your procedure. If we do not reach you, we will leave a message.  We will attempt to reach you two times.  During this call, we will ask if you have developed any symptoms of COVID 19. If you develop any symptoms (ie: fever, flu-like symptoms, shortness of breath, cough etc.) before then, please call [phone].  If you test positive for Covid 19 in the 2 weeks post procedure, please call and report this information to us.    If any biopsies were taken you will be contacted by phone or by letter within the next 1-3 weeks.  Please call us at [phone] if you have not heard about the biopsies in 3 weeks.    SIGNATURES/CONFIDENTIALITY: You and/or your care partner have signed paperwork which will be entered into your electronic medical record.  These signatures attest to the fact that that the information above on your After Visit Summary has been reviewed and is understood.  Full responsibility of the confidentiality of this discharge information lies with you and/or your care-partner. 

## 2021-10-18 NOTE — Progress Notes (Signed)
Called to room to assist during endoscopic procedure.  Patient ID and intended procedure confirmed with present staff. Received instructions for my participation in the procedure from the performing physician.  

## 2021-10-18 NOTE — Progress Notes (Signed)
To pacu, VSS. Report to rn.tb °

## 2021-10-18 NOTE — Op Note (Signed)
[facility] Patient Name: Anita Bryant Procedure Date: 10/18/2021 9:33 AM MRN: [id number] Endoscopist: Gerrit Heck , MD Age: 41 Referring MD:  Date of Birth: [birth year] Gender: Female Account #: [id number] Procedure:                Upper GI endoscopy Indications:              Epigastric abdominal pain, Dysphagia, Heartburn,                            Follow-up of reflux esophagitis, Follow-up of                            esophageal stricture                           07/2021: EGD: Stenosis at 33 cm dilated with 16 mm                            TTS balloon then fractured with forceps, LA Grade B                            esophagitis, 2 benign esophageal polyps. Esophageal                            biopsies negative for EOE. 2 cm HH. Normal                            stomach/duodenum (benign path from both). Treated                            with Nexium 40 mg twice daily x6 weeks. Medicines:                Monitored Anesthesia Care Procedure:                Pre-Anesthesia Assessment:                           - Prior to the procedure, a History and Physical                            was performed, and patient medications and                            allergies were reviewed. The patient's tolerance of                            previous anesthesia was also reviewed. The risks                            and benefits of the procedure and the sedation                            options and risks were discussed with the patient.  All questions were answered, and informed consent                            was obtained. Prior Anticoagulants: The patient has                            taken no previous anticoagulant or antiplatelet                            agents. ASA Grade Assessment: II - A patient with                            mild systemic disease. After reviewing the risks                            and benefits, the patient was deemed in                             satisfactory condition to undergo the procedure.                           After obtaining informed consent, the endoscope was                            passed under direct vision. Throughout the                            procedure, the patient's blood pressure, pulse, and                            oxygen saturations were monitored continuously. The                            GIF [id number] #[id number] was introduced through the                            mouth, and advanced to the second part of duodenum.                            The upper GI endoscopy was accomplished without                            difficulty. The patient tolerated the procedure                            well. Scope In: Scope Out: Findings:                 A 3 cm hiatal hernia was present. Hill Grade 3                            valve noted on retroflexed views with 3 cm  transverse width hernia.                           One benign-appearing, intrinsic mild stenosis was                            found 33 cm from the incisors. This stenosis                            measured 1 cm (in length). The stenosis was                            traversed. A TTS dilator was passed through the                            scope. Dilation with an 18-19-20 mm balloon dilator                            was performed to 20 mm. The dilation site was                            examined and showed no bleeding, mucosal tear or                            perforation. This was then biopsied with a cold                            forceps for further fracturing of the ring.                            Estimated blood loss was minimal.                           A single less than 5 mm polyp with no bleeding was                            found 30 cm from the incisors. The polyp was                            removed with a cold biopsy forceps. Resection and                             retrieval were complete. Estimated blood loss was                            minimal.                           The mucosa was otherwise normal appearing                            throughout the remainder of the esophagus. The Z  line was normal. Biopsies were obtained from the                            proximal and distal esophagus with cold forceps for                            histology of suspected eosinophilic esophagitis.                            Estimated blood loss was minimal.                           The entire examined stomach was normal. Biopsies                            were taken with a cold forceps for Helicobacter                            pylori testing. Estimated blood loss was minimal.                           The examined duodenum was normal. Complications:            No immediate complications. Estimated Blood Loss:     Estimated blood loss was minimal. Impression:               - 3 cm hiatal hernia.                           - Benign-appearing esophageal stenosis. Dilated                            wiht 20 mm TTS balloon then fractured with forceps.                           - Esophageal polyp was found in the mid esophagus.                            Resected and retrieved.                           - Otherwise, normal mucosa was found in the                            esophagus. Biopsied.                           - The previously noted erosive esophagitis has                            since healed.                           - Normal stomach. Biopsied.                           -  Normal examined duodenum. Recommendation:           - Patient has a contact number available for                            emergencies. The signs and symptoms of potential                            delayed complications were discussed with the                            patient. Return to normal activities tomorrow.                            Written  discharge instructions were provided to the                            patient.                           - Resume previous diet.                           - Resume Nexium as prescreibed.                           - Continue present medications.                           - Await pathology results.                           - Return to GI clinic in 3 months or sooner as                            needed. Gerrit Heck, MD 10/18/2021 10:12:22 AM

## 2021-10-20 ENCOUNTER — Telehealth: Payer: Self-pay | Admitting: *Deleted

## 2021-10-20 ENCOUNTER — Telehealth: Payer: Self-pay

## 2021-10-20 NOTE — Telephone Encounter (Signed)
°  Follow up Call-  Call back number 10/18/2021 08/09/2021  Post procedure Call Back phone  # [phone]  Permission to leave phone message Yes Yes  Some recent data might be hidden    No answer at 2nd attempt follow up phone call.  Left message on voicemail.

## 2021-10-20 NOTE — Telephone Encounter (Signed)
°  Follow up Call-  Call back number 10/18/2021 08/09/2021  Post procedure Call Back phone  # [phone]  Permission to leave phone message Yes Yes  Some recent data might be hidden     F/u call attempted, no answer, left voicemail.

## 2021-10-28 ENCOUNTER — Ambulatory Visit (HOSPITAL_COMMUNITY)
Admission: RE | Admit: 2021-10-28 | Discharge: 2021-10-28 | Disposition: A | Discharge status: Home / Self Care | Payer: No Typology Code available for payment source | Source: Ambulatory Visit | Attending: Gastroenterology | Admitting: Gastroenterology

## 2021-10-28 ENCOUNTER — Other Ambulatory Visit: Payer: Self-pay

## 2021-10-28 DIAGNOSIS — R1013 Epigastric pain: Secondary | ICD-10-CM | POA: Insufficient documentation

## 2021-10-28 MED ORDER — TECHNETIUM TC 99M MEBROFENIN IV KIT
5.4000 | PACK | Freq: Once | INTRAVENOUS | Status: AC
  Administered 2021-10-28: 5.4 via INTRAVENOUS

## 2021-10-31 ENCOUNTER — Encounter: Payer: Self-pay | Admitting: Gastroenterology

## 2021-10-31 NOTE — Telephone Encounter (Signed)
Agree with recommendations as stated. Can also try low FODMAP diet. If still ongoing sxs, can trial course of Bentyl 10 mg PO prn Q6hours to take prn abdominal pain and schedule f/u appt in office.

## 2021-11-11 ENCOUNTER — Telehealth: Payer: No Typology Code available for payment source | Admitting: Physician Assistant

## 2021-11-11 DIAGNOSIS — J019 Acute sinusitis, unspecified: Secondary | ICD-10-CM | POA: Diagnosis not present

## 2021-11-11 DIAGNOSIS — B9689 Other specified bacterial agents as the cause of diseases classified elsewhere: Secondary | ICD-10-CM

## 2021-11-11 MED ORDER — DOXYCYCLINE HYCLATE 100 MG PO TABS: 100.0000 mg | ORAL_TABLET | Freq: Two times a day (BID) | ORAL | 0 refills | Status: DC

## 2021-11-11 NOTE — Progress Notes (Signed)

## 2021-11-21 ENCOUNTER — Encounter: Payer: No Typology Code available for payment source | Admitting: Obstetrics and Gynecology

## 2021-11-29 ENCOUNTER — Other Ambulatory Visit: Payer: Self-pay

## 2021-11-29 ENCOUNTER — Ambulatory Visit (INDEPENDENT_AMBULATORY_CARE_PROVIDER_SITE_OTHER): Payer: No Typology Code available for payment source

## 2021-11-29 ENCOUNTER — Other Ambulatory Visit (HOSPITAL_COMMUNITY)
Admission: RE | Admit: 2021-11-29 | Discharge: 2021-11-29 | Disposition: A | Discharge status: Home / Self Care | Payer: No Typology Code available for payment source | Source: Ambulatory Visit

## 2021-11-29 VITALS — BP 125/84 | HR 91 | Ht 61.0 in | Wt 175.0 lb

## 2021-11-29 DIAGNOSIS — Z124 Encounter for screening for malignant neoplasm of cervix: Secondary | ICD-10-CM | POA: Diagnosis present

## 2021-11-29 DIAGNOSIS — Z1239 Encounter for other screening for malignant neoplasm of breast: Secondary | ICD-10-CM

## 2021-11-29 DIAGNOSIS — Z01419 Encounter for gynecological examination (general) (routine) without abnormal findings: Secondary | ICD-10-CM

## 2021-11-29 NOTE — Progress Notes (Signed)
? ? ?GYNECOLOGY OFFICE VISIT NOTE-WELL WOMAN EXAM ? ?History:  ? Anita Bryant [id number] here today for her "exam." She reports no current issues, but does desire conception.  She states she has been trying to conceive, but her husband has some fertility issues.  She reports she has normal monthly menses that lasts ~ 6 days and is light to moderate to light flow.  She reports minimal cramping and takes ibuprofen with relief when it occurs.  ? ?Birth Control:  None ? ?Reproductive Concerns ?Sexually Active: Yes ?Partners Type: Female ?Number of partners in last year: One-Husband ?STD Testing: Declines ? ?Vaginal/GU Concerns: No concerns.  No issues with urination, constipation, or diarrhea. No pain or discomfort during sexual activity.  ?Breast Concerns/Exams: No breast concerns. Performs SBE every couple of months.  Displays SBA.   ?Denies family history of breast, uterine, cervical, or ovarian cancer ? ?Medical and Nutrition ?PCP:J. [facility]. Last seen Sept 2022 ?Significant PMx: Asthma, "Thyroid Problems," GERD with Endoscopy  ?Exercise: 5x/wk for 30 min prior to work.  None recently d/t arm injury. Declined Sports Medicine/Ortho Referral.  ?Tobacco/Drugs/Alcohol: None ?Nutrition: Endorses balanced intake ? ?Social ?Safety at home: ?DV/A: ?Social Support: ?Employment: ? ?Past Medical History:  ?Diagnosis Date  ? Asthma   ? GERD (gastroesophageal reflux disease)   ? Hashimoto's disease   ? History of nephrolithiasis   ? ? ?Past Surgical History:  ?Procedure Laterality Date  ? ANKLE SURGERY Right   ? CARPAL TUNNEL WITH CUBITAL TUNNEL    ? KIDNEY STONE SURGERY    ? UPPER GASTROINTESTINAL ENDOSCOPY    ? ? ?The following portions of the patient's history were reviewed and updated as appropriate: allergies, current medications, past family history, past medical history, past social history, past surgical history and problem list.  ? ?Health Maintenance:  No pap on record.  No mammogram on record d/t age. Order for initial  screening placed ? ?Review of Systems:  ?Pertinent items noted in HPI and remainder of comprehensive ROS otherwise negative.   ? ?Objective:  ?  ?Physical Exam ?BP 125/84   Pulse 91   Ht '5\' 1"'$  (1.549 m)   Wt 175 lb (79.4 kg)   LMP 11/23/2021   BMI 33.07 kg/m?  ?Physical Exam ?Constitutional:   ?   General: She is not in acute distress. ?   Appearance: Normal appearance. She is not toxic-appearing.  ?Genitourinary:  ?   Genitourinary Comments: Pap collected with brush and spatula.  ?CBE completed  ?   Right Labia: No tenderness or lesions. ?   Left Labia: No tenderness or lesions. ?   No vaginal discharge, tenderness, bleeding or ulceration.  ?   No cervical motion tenderness, discharge, friability, lesion, polyp or nabothian cyst.  ?   Uterus is not enlarged or tender.  ?Breasts: ?   Right: No mass, nipple discharge, skin change or tenderness.  ?   Left: Normal. No mass, nipple discharge, skin change or tenderness.  ?HENT:  ?   Head: Normocephalic and atraumatic.  ?Eyes:  ?   Conjunctiva/sclera: Conjunctivae normal.  ?Cardiovascular:  ?   Rate and Rhythm: Normal rate and regular rhythm.  ?   Heart sounds: Normal heart sounds.  ?Pulmonary:  ?   Effort: Pulmonary effort is normal. No respiratory distress.  ?   Breath sounds: Normal breath sounds.  ?Abdominal:  ?   General: Bowel sounds are normal.  ?   Tenderness: There is no abdominal tenderness.  ?Musculoskeletal:     ?  General: Normal range of motion.  ?   Cervical back: Normal range of motion.  ?Neurological:  ?   Mental Status: She is alert and oriented to person, place, and time.  ?Skin: ?   General: Skin is warm and dry.  ?Psychiatric:     ?   Mood and Affect: Mood normal.     ?   Behavior: Behavior normal.     ?   Thought Content: Thought content normal.  ?Vitals reviewed. Exam conducted with a chaperone present.  ?  ?Labs and Imaging ?No results found for this or any previous visit (from the past 168 hour(s)). ?No results found. ?  ?Assessment & Plan:   ?41 year old  ?Well Woman Exam ?Pap Smear ?Clinical Breast Exam ? ?1. Well woman exam with routine gynecological exam ?-Exam performed and findings discussed. ?-Encouraged to activate and utilize Mychart for reviewing of results, communication with office, and scheduling of appts. ?- Cytology - PAP( [facility]) ?- MM 3D SCREEN BREAST BILATERAL; Future ? ?2. Encounter for screening breast examination ?-Educated and encouraged to continue SBE with increased breast awareness including examination of breast for skin changes, moles, tenderness, etc.  ?-Plan for initial mammogram screening in 4-6 months. Order placed ?- MM 3D SCREEN BREAST BILATERAL; Future ? ?3. Pap smear for cervical cancer screening ?-Educated on ASCCP guidelines regarding pap smear evaluation and frequency. ?-Informed of turnover time and provider/clinic policy on releasing results. ?- Cytology - PAP( [facility]) ? ? ?Routine preventative health maintenance measures emphasized. ?Please refer to After Visit Summary for other counseling recommendations.  ? ? ? ?Maryann Conners, CNM ?11/29/2021  ? ? ? ? ?

## 2021-12-01 LAB — CYTOLOGY - PAP
Comment: NEGATIVE
Diagnosis: NEGATIVE
High risk HPV: NEGATIVE

## 2022-01-02 ENCOUNTER — Encounter: Payer: Self-pay | Admitting: Internal Medicine

## 2022-01-02 ENCOUNTER — Ambulatory Visit (INDEPENDENT_AMBULATORY_CARE_PROVIDER_SITE_OTHER): Payer: No Typology Code available for payment source | Admitting: Internal Medicine

## 2022-01-02 VITALS — BP 116/70 | HR 93 | Ht 61.0 in | Wt 176.0 lb

## 2022-01-02 DIAGNOSIS — E559 Vitamin D deficiency, unspecified: Secondary | ICD-10-CM | POA: Diagnosis not present

## 2022-01-02 DIAGNOSIS — E063 Autoimmune thyroiditis: Secondary | ICD-10-CM

## 2022-01-02 LAB — VITAMIN D 25 HYDROXY (VIT D DEFICIENCY, FRACTURES): VITD: 25.59 ng/mL — ABNORMAL LOW (ref 30.00–100.00)

## 2022-01-02 LAB — TSH: TSH: 5.86 u[IU]/mL — ABNORMAL HIGH (ref 0.35–5.50)

## 2022-01-02 LAB — T4, FREE: Free T4: 0.85 ng/dL (ref 0.60–1.60)

## 2022-01-02 NOTE — Progress Notes (Signed)
? ?Name: Anita Bryant  ?MRN/ DOB: [id number], 07/06/1981    ?Age/ Sex: 41 y.o., female   ? ? ?PCP: Samuel Bouche, NP   ?Reason for Endocrinology Evaluation: Subclinical hypothyroidism  ?   ?Initial Endocrinology Clinic Visit: 05/25/2020  ? ? ?PATIENT IDENTIFIER: [name] is a 41 y.o., female with a past medical history of Asthma. She has followed with [facility] since 05/25/2020 for consultative assistance with management of her subclinical hypothyroidism ? ?HISTORICAL SUMMARY:  ? ?Pt has been noted to have an intermittent elevated in TSH since 2011 with a max level of 6.0 uIU/mL in 04/2020 with normal FT4 at 1.3 ng/dL and normal TT3 at 125 ng/dL.  ?Anti-TPO Ab 20 IU/mL  ? ?Mother with hypothyroidism ( Hashimoto's )  ? ?LT-4 replacement started 12/2021 due to symptoms of fatigue and hair loss as well as a TSH of 5.86 u IU/mL ? ? ? ?SUBJECTIVE:  ? ? ? 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  ? ?Weight has been stable  ?Denies local neck swelling  ?Has occasional constipation  ?Energy level is stable but tired all the time  ?Has been exercising intermittently  ?Has noted hair loss and thinning of eye brow as well as decreased attention span  ? ? ?She has been diagnosed with hiatal hernia, she has GERD , has required multiple esophageal dilatations. Currently on PPI  ? ? ? ?She continues with Vitamin D 2000 iu daily  ? ? ?HISTORY:  ?Past Medical History:  ?Past Medical History:  ?Diagnosis Date  ? Asthma   ? GERD (gastroesophageal reflux disease)   ? Hashimoto's disease   ? History of nephrolithiasis   ? ?Past Surgical History:  ?Past Surgical History:  ?Procedure Laterality Date  ? ANKLE SURGERY Right   ? CARPAL TUNNEL WITH CUBITAL TUNNEL    ? KIDNEY STONE SURGERY    ? UPPER GASTROINTESTINAL ENDOSCOPY    ? ?Social History:  reports that she has never smoked. She has never used smokeless tobacco. She reports that she  does not drink alcohol and does not use drugs. ?Family History:  ?Family History  ?Problem Relation Age of Onset  ? Thyroid disease Mother   ? Cancer Father   ?     MDS cancer, Swed's disease  ? Colon cancer Maternal Great-grandfather   ? Esophageal cancer Neg Hx   ? Rectal cancer Neg Hx   ? Liver disease Neg Hx   ? Pancreatic cancer Neg Hx   ? ? ? ?HOME MEDICATIONS: ?Allergies as of 01/02/2022   ? ?   Reactions  ? Cefdinir Nausea And Vomiting  ? Decadron [dexamethasone] Nausea And Vomiting  ? ?  ? ?  ?Medication List  ?  ? ?  ? Accurate as of January 02, 2022  3:29 PM. If you have any questions, ask your nurse or doctor.  ?  ?  ? ?  ? ?STOP taking these medications   ? ?doxycycline 100 MG tablet ?Commonly known as: VIBRA-TABS ?Stopped by: Dorita Sciara, MD ?  ? ?  ? ?TAKE these medications   ? ?acetaminophen 325 MG tablet ?Commonly known as: TYLENOL ?Take 650 mg by mouth every 4 (four) hours as needed. ?  ?albuterol 108 (90 Base) MCG/ACT inhaler ?Commonly known as: VENTOLIN HFA ?Inhale 1-2 puffs into the lungs every 4 (four) hours as needed for wheezing or shortness of breath. ?  ?clobetasol  cream 0.05 % ?Commonly known as: TEMOVATE ?Apply 1 application topically 2 (two) times daily as needed. ?  ?esomeprazole 40 MG capsule ?Commonly known as: NexIUM ?Take 1 capsule (40 mg total) by mouth 2 (two) times daily before a meal. 40 mg BID for 6 weeks then reduce to 40 mg daily. ?  ?ibuprofen 200 MG tablet ?Commonly known as: ADVIL ?Take 200 mg by mouth every 6 (six) hours as needed. ?  ?Prenatal One Daily 27-0.8 MG Tabs ?Take 1 tablet by mouth daily. ?  ? ?  ? ? ? ? ?OBJECTIVE:  ? ?PHYSICAL EXAM: ?VS: BP 116/70 (BP Location: Left Arm, Patient Position: Sitting, Cuff Size: Large)   Pulse 93   Ht '5\' 1"'$  (1.549 m)   Wt 176 lb (79.8 kg)   SpO2 98%   BMI 33.25 kg/m?   ? ?EXAM: ?General: Pt appears well and is in NAD  ?Neck: General: Supple without adenopathy. ?Thyroid: Thyroid size normal.  No goiter or nodules  appreciated.  ?Lungs: Clear with good BS bilat with no rales, rhonchi, or wheezes  ?Heart: Auscultation: RRR.  ?Abdomen: Normoactive bowel sounds, soft, nontender, without masses or organomegaly palpable  ?Extremities:  ?BL LE: No pretibial edema normal ROM and strength.  ?Mental Status: Judgment, insight: Intact ?Orientation: Oriented to time, place, and person ?Mood and affect: No depression, anxiety, or agitation  ? ? ? ?DATA REVIEWED: ? Latest Reference Range & Units 01/02/22 14:54  ?TSH 0.35 - 5.50 uIU/mL 5.86 (H)  ?T4,Free(Direct) 0.60 - 1.60 ng/dL 0.85  ? ? Latest Reference Range & Units 01/02/22 14:54  ?VITD 30.00 - 100.00 ng/mL 25.59 (L)  ? ?Results for Anita Bryant, Anita Bryant (MRN [id number]) as of 08/24/2020 08:30 ? Ref. Range 05/25/2020 10:57  ?Thyroperoxidase Ab SerPl-aCnc Latest Ref Range: <9 IU/mL 20 (H)  ? ? ?ASSESSMENT / PLAN / RECOMMENDATIONS:  ? ? ? Hashimoto's Thyroiditis  ?  ?- Pt with symptoms of hair loss, fatigue, thinning of eyebrows ?- No local neck symptoms  ?- Repeat TSH continues to be elevated, given her symptoms we opted to start LT-for replacement ?- - Pt educated extensively on the correct way to take levothyroxine (first thing in the morning with water, 30 minutes before eating or taking other medications). ?- Pt encouraged to double dose the following day if she were to miss a dose given long half-life of levothyroxine. ? ?Medication ?Start levothyroxine 25 mcg daily ? ? ?2.  Vitamin D insufficiency: ? ?-Vitamin D remains low, will asked the patient to increase dose as below ? ? ?Increase vitamin D3 3000 IU daily ? ?F/U in 1 yr  ?Labs in 2 months ? ?Signed electronically by: ?Abby Nena Jordan, MD ? ?[facility]  ?[facility] ?[address] ?[place], [place] [address] ?Phone: [phone] ?FAX: [phone]  ? ? ? ? ?CC: ?Samuel Bouche, NP ?[address] ?[place] [address] ?Phone: [phone]  ?Fax: [phone] ? ? ?Return to  Endocrinology clinic as below: ?Future Appointments  ?Date Time Provider [employer]  ?01/05/2022  8:40 AM Cirigliano, Dominic Pea, DO [facility]  ?03/03/2022  9:50 AM Samuel Bouche, NP [facility] None  ?03/06/2022  3:45 PM [facility] None  ?01/03/2023  9:10 AM Shaterrica Territo, Melanie Crazier, MD [facility] None  ?  ? ?

## 2022-01-02 NOTE — Patient Instructions (Signed)

## 2022-01-03 ENCOUNTER — Encounter: Payer: Self-pay | Admitting: Internal Medicine

## 2022-01-03 MED ORDER — LEVOTHYROXINE SODIUM 25 MCG PO TABS: 25.0000 ug | ORAL_TABLET | Freq: Every day | ORAL | 1 refills | Status: DC

## 2022-01-05 ENCOUNTER — Encounter: Payer: Self-pay | Admitting: Gastroenterology

## 2022-01-05 ENCOUNTER — Ambulatory Visit (INDEPENDENT_AMBULATORY_CARE_PROVIDER_SITE_OTHER): Payer: No Typology Code available for payment source | Admitting: Gastroenterology

## 2022-01-05 VITALS — BP 110/70 | HR 85 | Ht 61.0 in | Wt 176.0 lb

## 2022-01-05 DIAGNOSIS — K222 Esophageal obstruction: Secondary | ICD-10-CM | POA: Diagnosis not present

## 2022-01-05 DIAGNOSIS — K449 Diaphragmatic hernia without obstruction or gangrene: Secondary | ICD-10-CM | POA: Diagnosis not present

## 2022-01-05 DIAGNOSIS — R1310 Dysphagia, unspecified: Secondary | ICD-10-CM

## 2022-01-05 DIAGNOSIS — R1013 Epigastric pain: Secondary | ICD-10-CM

## 2022-01-05 DIAGNOSIS — K219 Gastro-esophageal reflux disease without esophagitis: Secondary | ICD-10-CM | POA: Diagnosis not present

## 2022-01-05 MED ORDER — ESOMEPRAZOLE MAGNESIUM 40 MG PO CPDR: 40.0000 mg | DELAYED_RELEASE_CAPSULE | Freq: Every day | ORAL | 5 refills | Status: DC

## 2022-01-05 NOTE — Patient Instructions (Addendum)
If you are age 40 or younger, your body mass index should be between 19-25. Your There is no height or weight on file to calculate BMI. If this is out of the aformentioned range listed, please consider follow up with your Primary Care Provider.  ? ?__________________________________________________________ ? ?The [facility] providers would like to encourage you to use [facility] to communicate with providers for non-urgent requests or questions.  Due to long hold times on the telephone, sending your provider a message by [facility] may be a faster and more efficient way to get a response.  Please allow 48 business hours for a response.  Please remember that this is for non-urgent requests.  ? ?Due to recent changes in healthcare laws, you may see the results of your imaging and laboratory studies on MyChart before your provider has had a chance to review them.  We understand that in some cases there may be results that are confusing or concerning to you. Not all laboratory results come back in the same time frame and the provider may be waiting for multiple results in order to interpret others.  Please give [place] 48 hours in order for your provider to thoroughly review all the results before contacting the office for clarification of your results.  ? ?We have sent the following medications to your pharmacy for you to pick up at your convenience: Nexium  ? ?You have been scheduled for an esophageal manometry test at [facility] on 04/26/22 at 8:30am. Please arrive 30 minutes prior to your procedure for registration. You will need to go to outpatient registration (1st floor of the hospital) first. Make certain to bring your insurance cards as well as a complete list of medications. ? ?Please remember the following: ? ?1) Do not take any muscle relaxants, xanax (alprazolam) or ativan for 1 day prior to your test as well as the day of the test. ? ?2) Nothing to eat or drink after 12:00 midnight on the night before your  test. ? ?3) Hold all diabetic medications/insulin the morning of the test. You may eat and take your medications after the test. ? ?It will take at least 2 weeks to receive the results of this test from your physician. ? ?------------------------------------------ ?ABOUT ESOPHAGEAL MANOMETRY ?Esophageal manometry (muh-NOM-uh-tree) is a test that gauges how well your esophagus works. Your esophagus is the long, muscular tube that connects your throat to your stomach. Esophageal manometry measures the rhythmic muscle contractions (peristalsis) that occur in your esophagus when you swallow. Esophageal manometry also measures the coordination and force exerted by the muscles of your esophagus.  ?During esophageal manometry, a thin, flexible tube (catheter) that contains sensors is passed through your nose, down your esophagus and into your stomach. Esophageal manometry can be helpful in diagnosing some mostly uncommon disorders that affect your esophagus.  ?Why it's done ?Esophageal manometry is used to evaluate the movement (motility) of food through the esophagus and into the stomach. The test measures how well the circular bands of muscle (sphincters) at the top and bottom of your esophagus open and close, as well as the pressure, strength and pattern of the wave of esophageal muscle contractions that moves food along.  ?What you can expect ?Esophageal manometry is an outpatient procedure done without sedation. Most people tolerate it well. You may be asked to change into a hospital gown before the test starts.  ?During esophageal manometry  ?While you are sitting up, a member of your health care team sprays your  throat with a numbing medication or puts numbing gel in your nose or both.  ?A catheter is guided through your nose into your esophagus. The catheter may be sheathed in a water-filled sleeve. It doesn't interfere with your breathing. However, your eyes may water, and you may gag. You may have a slight  nosebleed from irritation.  ?After the catheter is in place, you may be asked to lie on your back on an exam table, or you may be asked to remain seated.  ?You then swallow small sips of water. As you do, a computer connected to the catheter records the pressure, strength and pattern of your esophageal muscle contractions.  ?During the test, you'll be asked to breathe slowly and smoothly, remain as still as possible, and swallow only when you're asked to do so.  ?A member of your health care team may move the catheter down into your stomach while the catheter continues its measurements.  ?The catheter then is slowly withdrawn. ?The test usually lasts 20 to 30 minutes.  ?After esophageal manometry  ?When your esophageal manometry is complete, you may return to your normal activities ? ?This test typically takes 30-45 minutes to complete. ?________________________________________________________________________________  ? ? ?Thank you for choosing me and [facility]. ? ?Gerrit Heck, D.O. ? ? ?We want to thank you for trusting [facility] with your care. All of our staff and providers value the relationships we have built with our patients, and it is an honor to care for you.  ? ?We are writing to let you know that [facility] will close on Feb 06, 2022, and we invite you to continue to see Dr. Carmell Austria and Gerrit Heck at the [facility] [place] office location. We are consolidating our serices at these [facility] practices to better provide care. Our office staff will work with you to ensure a seamless transition.  ? ?Gerrit Heck, DO -Dr. Bryan Lemma will be movig to [facility] at [address] [place], [place] [address], effective Feb 06, 2022.  Contact [phone] to schedule an appointment with him.  ? ?Carmell Austria, MD- Dr. Lyndel Safe will be movig to [facility] at [address], [place], [place] [address],  effective Feb 06, 2022.  Contact [phone] to schedule an appointment with him.  ? ?Requesting Medical Records ?If you need to request your medical records, please follow the instructions below. Your medical records are confidential, and a copy can be transferred to another provider or released to you or another person you designate only with your permission. ? ?There are several ways to request your medical records: ?Requests for medical records can be submitted through our practice.   ?You can also request your records electronically, in your MyChart account by selecting the ?Request Health Records? tab.  ?If you need additional information on how to request records, please go to [email or web address]/, choose Patient Information, then select Request Medical Records. ?To make an appointment or if you have any questions about your health care needs, please contact our office at [phone] and one of our staff members will be glad to assist you. ?[facility] is committed to providing exceptional care for you and our community. Thank you for allowing [place] to serve your health care needs. ?Sincerely, ? ?Windy Canny, Director [facility] ?[facility] also offers convenient virtual care options. Sore throat? Sinus problems? Cold or flu symptoms? Get care from the comfort of home with [facility] Video Visits and e-Visits.  Learn more about the non-emergency conditions treated and start your virtual visit at [email or web address]  ? ?

## 2022-01-05 NOTE — Progress Notes (Signed)
? ?Chief Complaint:    Dysphagia ? ?GI History: 41 year old female with no significant medical history, initially seen in the GI clinic on 06/14/2021 for evaluation of progressive dysphagia and odynophagia x8 months, pointing to mid sternum. ?  ?Separately with a history of reflux symptoms. Index sxs of HB and belching. Sleeps with HOB elevated and avoids exacerbating foods. No improvement with trial of high dose PPI.  ? ?- Trialed course of pantoprazole and Pepcid without improvement ?- 04/18/2021: Evaluated by ENT.  Normal upper airway exam ?- 05/16/2021: MBS: Normal oral/pharyngeal phase of swallow.  Pill travel through esophagus and appeared to remain in the LES accompanied by report of globus sensation from patient.  Pill eventually cleared with clear liquids.  Recommended GI referral. ?- 08/09/2021: EGD: Stenosis at 33 cm dilated with 16 mm TTS balloon then fractured with forceps, LA Grade B esophagitis, 2 benign esophageal polyps.  Esophageal biopsies negative for EOE.  2 cm HH.  Normal stomach/duodenum (benign path from both).  Treated with Nexium 40 mg twice daily x6 weeks with recommended repeat upper endoscopy at 8 weeks ?- 09/27/2021: GI follow-up.  No dysphagia.  C/o Postprandial MEG pain.  Recommended trial of FD guard and further evaluation with EGD and HIDA as below ?- 10/18/2021: EGD: 3 cm HH with Hill grade 3 valve.  Mild stenosis at 33 cm dilated with 20 mm TTS balloon (no rent) then fractured with forceps.  5 mm esophageal polyp (path: Benign).  Esophageal biopsies with reflux changes but negative for EOE.  Normal stomach (normal biopsies), normal duodenum ?  ?  ?Additionally, history of epigastric pain for which she was seen 11/2018.  Epigastric pain initially improved with Protonix 40 mg/day, but less efficacious over time. ?- CT abdomen/pelvis (12/19/2018): left hepatic hypodensities measuring 13 x 11 mm and 6 x 8 mm, likely small cysts or hemangiomata.  Otherwise no duct dilatation, normal GB and  pancreas, moderate stool retention but otherwise normal GI tract. ?- RUQ [place] (12/18/2018): normal GB no duct dilatation.  Fatty liver infiltration noted and 10 x 14 x 13 mm and 9 x 7 x 8 mm cysts noted in left liver, consistent with CT findings, with no additional masses. ?- Normal lipase, CBC, CMP ?- Recommended repeat ultrasound or MRI liver after 12 months to ensure size/appearance stability ?-CT abdomen/pelvis (01/21/2021): Unchanged 1.5 cm simple cyst in left hepatic lobe.  Otherwise unremarkable. ?-05/2021: Follow-up appointment in the GI clinic.  Ongoing intermittent MEG pain. ?- 07/2021: EGD as above ?- 10/18/2021: EGD as above ?- 10/28/2021: HIDA scan normal ? ? ?  ? ?HPI:   ? ? ?Patient is a 41 y.o. female presenting to the Gastroenterology Clinic for evaluation of dysphagia.  She has been having solid food dysphagia for the last 3 weeks or so, worse with thick consistency foods like burger, muffin, breading. Points to upper sternum. No regurgitation of foods;  eventually clears with fluids.  No fluid action. ? ?Reflux o/w well controlled with Nexium, but breakthrough with any missed dose.   ? ? ?Review of systems:     No chest pain, no SOB, no fevers, no urinary sx  ? ?Past Medical History:  ?Diagnosis Date  ? Asthma   ? GERD (gastroesophageal reflux disease)   ? Hashimoto's disease   ? History of nephrolithiasis   ? ? ?Patient's surgical history, family medical history, social history, medications and allergies were all reviewed in Epic  ? ? ?Current Outpatient Medications  ?Medication Sig Dispense Refill  ?  acetaminophen (TYLENOL) 325 MG tablet Take 650 mg by mouth every 4 (four) hours as needed.    ? albuterol (VENTOLIN HFA) 108 (90 Base) MCG/ACT inhaler Inhale 1-2 puffs into the lungs every 4 (four) hours as needed for wheezing or shortness of breath. 18 g 3  ? clobetasol cream (TEMOVATE) 6.64 % Apply 1 application topically 2 (two) times daily as needed. 60 g 2  ? esomeprazole (NEXIUM) 40 MG capsule Take 1  capsule (40 mg total) by mouth 2 (two) times daily before a meal. 40 mg BID for 6 weeks then reduce to 40 mg daily. 60 capsule 2  ? ibuprofen (ADVIL) 200 MG tablet Take 200 mg by mouth every 6 (six) hours as needed.    ? levothyroxine (SYNTHROID) 25 MCG tablet Take 1 tablet (25 mcg total) by mouth daily before breakfast. 90 tablet 1  ? Prenatal Vit-Fe Fumarate-FA (PRENATAL ONE DAILY) 27-0.8 MG TABS Take 1 tablet by mouth daily. 90 tablet 3  ? ?No current facility-administered medications for this visit.  ? ? ?Physical Exam:   ? ? ?BP 110/70   Pulse 85   Ht '5\' 1"'$  (1.549 m)   Wt 176 lb (79.8 kg)   BMI 33.25 kg/m?  ? ?GENERAL:  Pleasant female in NAD ?PSYCH: : Cooperative, normal affect ?Musculoskeletal:  Normal muscle tone, normal strength ?NEURO: Alert and oriented x 3, no focal neurologic deficits ? ? ?IMPRESSION and PLAN:   ? ?1) Dysphagia ?Most recent EGD in 1/23 with mild nonobstructing distal esophageal stricture, dilated with 20 mm TTS balloon then fractured with forceps.  Doubtful of swift return of clinically significant stricture.  Esophageal biopsies otherwise negative for EOE.  Discussed additional etiologies for return of solid food dysphagia with plan as follows: ?- Esophageal Manometry ? ?2) GERD ?3) Hiatal hernia ?4) Peptic stricture s/p dilation ?- Reflux symptoms otherwise well controlled on current therapy ?- Refill Nexium 40 mg daily. Will take pre-dinner to deconflict with Synthroid. If suboptimal control of symptoms, will move to lunch time dosing (caveat, patient does not always eat lunch) ?- Erosive esophagitis resolved on most recent EGD.  Esophageal biopsies consistent with reflux changes ?- Continue antireflux lifestyle/dietary modifications ? ?5) Epigastric pain ?- Improved ?- Evaluation otherwise largely unrevealing to include EGD x2, CT x2, RUQ [place], HIDA scan, labs ?- If symptoms return, trial course of FD guard ?    ?RTC in 6 months or sooner as needed ?    ? ?[name] ,DO,  FACG 01/05/2022, 8:52 AM ? ?

## 2022-01-09 ENCOUNTER — Encounter: Payer: Self-pay | Admitting: Gastroenterology

## 2022-01-09 NOTE — Telephone Encounter (Signed)
Yes, continue the Nexium 40 mg daily for the time being.  If reflux well controlled, we can try to decrease to 20 mg daily, but I suspect she will continue to need some dose of acid suppression therapy to manage reflux.  This is otherwise a safe drug to take long-term. ?

## 2022-02-10 ENCOUNTER — Encounter: Payer: Self-pay | Admitting: Medical-Surgical

## 2022-02-14 ENCOUNTER — Ambulatory Visit (INDEPENDENT_AMBULATORY_CARE_PROVIDER_SITE_OTHER): Payer: No Typology Code available for payment source

## 2022-02-14 ENCOUNTER — Ambulatory Visit: Payer: No Typology Code available for payment source | Admitting: Sports Medicine

## 2022-02-14 DIAGNOSIS — M62838 Other muscle spasm: Secondary | ICD-10-CM

## 2022-02-14 DIAGNOSIS — M542 Cervicalgia: Secondary | ICD-10-CM | POA: Diagnosis not present

## 2022-02-14 DIAGNOSIS — M25571 Pain in right ankle and joints of right foot: Secondary | ICD-10-CM | POA: Diagnosis not present

## 2022-02-14 DIAGNOSIS — S86311A Strain of muscle(s) and tendon(s) of peroneal muscle group at lower leg level, right leg, initial encounter: Secondary | ICD-10-CM

## 2022-02-14 MED ORDER — PREDNISONE 50 MG PO TABS: ORAL_TABLET | ORAL | 0 refills | Status: DC

## 2022-02-14 MED ORDER — BENZONATATE 200 MG PO CAPS: 200.0000 mg | ORAL_CAPSULE | Freq: Three times a day (TID) | ORAL | 0 refills | Status: DC | PRN

## 2022-02-14 NOTE — Addendum Note (Signed)
Addended by: Silverio Decamp on: 02/14/2022 03:48 PM   Modules accepted: Orders

## 2022-02-14 NOTE — Assessment & Plan Note (Addendum)
Pleasant 41 year old female, her husband is one of our air traffic controller is in [place], she has had a cough for about 3 weeks and has developed right-sided periscapular discomfort. No paresthesias down the arm to the fingertips. I think she likely is having cervical paraspinal muscle spasm from repeated episodes of Valsalva. Adding 5 days of steroids, cervical spine x-rays, home conditioning for the cervical spine. Tessalon Perles for the cough. Return to see me in 6 weeks for this.

## 2022-02-14 NOTE — Progress Notes (Addendum)
    Procedures performed today:    None.  Independent interpretation of notes and tests performed by another provider:   None.  Brief History, Exam, Impression, and Recommendations:    Cervical paraspinous muscle spasm Pleasant 41 year old female, her husband is one of our air traffic controller is in [place], she has had a cough for about 3 weeks and has developed right-sided periscapular discomfort. No paresthesias down the arm to the fingertips. I think she likely is having cervical paraspinal muscle spasm from repeated episodes of Valsalva. Adding 5 days of steroids, cervical spine x-rays, home conditioning for the cervical spine. Tessalon Perles for the cough. Return to see me in 6 weeks for this.  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 Lucia Gaskins at [facility]. Was doing well until a recent trip to a [place] race, where she did a lot of walking. Since then she has had severe pain lateral ankle behind the lateral malleolus with weakness to eversion on exam. 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 We will start conservatively with x-rays, lateral wedge, home conditioning. She will wear her boot when at the next [employer] race, I will give her a handicap placard for 3 months. Return to see me in approximately 6 weeks for consideration of MRI versus peroneal tendon sheath injection if not better.    ___________________________________________ Gwen Her. Dianah Field, M.D., ABFM., CAQSM. Primary Care and [place] Instructor of [employer] [facility] of [facility]

## 2022-02-14 NOTE — Assessment & Plan Note (Signed)
Anita Bryant also has had chronic right lateral ankle pain, it sounds like she ended up with a peroneal repair with Dr. Lucia Gaskins at [facility]. Was doing well until a recent trip to a [place] race, where she did a lot of walking. Since then she has had severe pain lateral ankle behind the lateral malleolus with weakness to eversion on exam. 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 We will start conservatively with x-rays, lateral wedge, home conditioning. She will wear her boot when at the next [employer] race, I will give her a handicap placard for 3 months. Return to see me in approximately 6 weeks for consideration of MRI versus peroneal tendon sheath injection if not better.

## 2022-03-03 ENCOUNTER — Encounter: Payer: Self-pay | Admitting: Medical-Surgical

## 2022-03-03 ENCOUNTER — Ambulatory Visit (INDEPENDENT_AMBULATORY_CARE_PROVIDER_SITE_OTHER): Payer: No Typology Code available for payment source | Admitting: Medical-Surgical

## 2022-03-03 VITALS — BP 131/86 | HR 75 | Resp 20 | Ht 61.0 in | Wt 175.9 lb

## 2022-03-03 DIAGNOSIS — Z0000 Encounter for general adult medical examination without abnormal findings: Secondary | ICD-10-CM | POA: Diagnosis not present

## 2022-03-03 MED ORDER — ALBUTEROL SULFATE HFA 108 (90 BASE) MCG/ACT IN AERS: 1.0000 | INHALATION_SPRAY | RESPIRATORY_TRACT | 3 refills | Status: DC | PRN

## 2022-03-03 NOTE — Progress Notes (Signed)
Complete physical exam  Patient: Anita Bryant   DOB: 03/23/1981   41 y.o. Female  MRN: [id number]  Subjective:    Chief Complaint  Patient presents with   Annual Exam   Anita Bryant is a 41 y.o. female who presents today for a complete physical exam. She reports consuming a general diet. Exercise is limited by orthopedic condition(s): right foot pain. She generally feels well. She reports sleeping fairly well. She does not have additional problems to discuss today.    Most recent fall risk assessment:    03/03/2022    9:57 AM  Fall Risk   Falls in the past year? 0  Number falls in past yr: 0  Injury with Fall? 0  Risk for fall due to : No Fall Risks  Follow up Falls evaluation completed     Most recent depression screenings:    03/03/2022    9:57 AM 05/10/2020    8:25 AM  PHQ 2/9 Scores  PHQ - 2 Score 0 0  PHQ- 9 Score  0   Vision:Within last year, Dental: No current dental problems and Receives regular dental care, and STD: The patient denies history of sexually transmitted disease.    Patient Care Team: Samuel Bouche, NP as PCP - General (Nurse Practitioner)   Outpatient Medications Prior to Visit  Medication Sig   acetaminophen (TYLENOL) 325 MG tablet Take 650 mg by mouth every 4 (four) hours as needed.   clobetasol cream (TEMOVATE) 3.01 % Apply 1 application topically 2 (two) times daily as needed.   esomeprazole (NEXIUM) 40 MG capsule Take 1 capsule (40 mg total) by mouth daily. 40 mg BID for 6 weeks then reduce to 40 mg daily.   ibuprofen (ADVIL) 200 MG tablet Take 200 mg by mouth every 6 (six) hours as needed.   levothyroxine (SYNTHROID) 25 MCG tablet Take 1 tablet (25 mcg total) by mouth daily before breakfast.   Prenatal Vit-Fe Fumarate-FA (PRENATAL ONE DAILY) 27-0.8 MG TABS Take 1 tablet by mouth daily.   [DISCONTINUED] albuterol (VENTOLIN HFA) 108 (90 Base) MCG/ACT inhaler Inhale 1-2 puffs into the lungs every 4 (four) hours as needed for wheezing or shortness  of breath.   [DISCONTINUED] benzonatate (TESSALON) 200 MG capsule Take 1 capsule (200 mg total) by mouth 3 (three) times daily as needed for cough. (Patient not taking: Reported on 03/03/2022)   [DISCONTINUED] predniSONE (DELTASONE) 50 MG tablet One tab PO daily for 5 days. (Patient not taking: Reported on 03/03/2022)   No facility-administered medications prior to visit.   Review of Systems  Constitutional:  Negative for chills, fever, malaise/fatigue and weight loss.  HENT:  Negative for congestion, ear pain, hearing loss, sinus pain and sore throat.   Eyes:  Negative for blurred vision, photophobia and pain.  Respiratory:  Negative for cough, shortness of breath and wheezing.   Cardiovascular:  Negative for chest pain, palpitations and leg swelling.  Gastrointestinal:  Negative for abdominal pain, constipation, diarrhea, heartburn, nausea and vomiting.  Genitourinary:  Negative for dysuria, frequency and urgency.  Musculoskeletal:  Negative for falls and neck pain.  Skin:  Negative for itching and rash.  Neurological:  Negative for dizziness, weakness and headaches.  Endo/Heme/Allergies:  Negative for polydipsia. Does not bruise/bleed easily.  Psychiatric/Behavioral:  Negative for depression, substance abuse and suicidal ideas. The patient is not nervous/anxious and does not have insomnia.       Objective:    BP 131/86 (BP Location: Right Arm, Cuff Size: Normal)  Pulse 75   Resp 20   Ht '5\' 1"'$  (1.549 m)   Wt 175 lb 14.4 oz (79.8 kg)   LMP 02/13/2022 (Exact Date)   SpO2 100%   BMI 33.24 kg/m    Physical Exam Constitutional:      General: She is not in acute distress.    Appearance: Normal appearance. She is obese. She is not ill-appearing.  HENT:     Head: Normocephalic and atraumatic.     Right Ear: Tympanic membrane, ear canal and external ear normal. There is no impacted cerumen.     Left Ear: Tympanic membrane, ear canal and external ear normal. There is no impacted  cerumen.     Nose: Nose normal.     Mouth/Throat:     Mouth: Mucous membranes are moist.     Pharynx: No oropharyngeal exudate or posterior oropharyngeal erythema.  Eyes:     General: No scleral icterus.       Right eye: No discharge.        Left eye: No discharge.     Extraocular Movements: Extraocular movements intact.     Conjunctiva/sclera: Conjunctivae normal.     Pupils: Pupils are equal, round, and reactive to light.  Neck:     Thyroid: No thyromegaly.     Vascular: No carotid bruit or JVD.     Trachea: Trachea normal.  Cardiovascular:     Rate and Rhythm: Normal rate and regular rhythm.     Pulses: Normal pulses.     Heart sounds: Normal heart sounds. No murmur heard.    No friction rub. No gallop.  Pulmonary:     Effort: Pulmonary effort is normal. No respiratory distress.     Breath sounds: Normal breath sounds. No wheezing.  Abdominal:     General: Bowel sounds are normal. There is no distension.     Palpations: Abdomen is soft.     Tenderness: There is no abdominal tenderness. There is no guarding.  Musculoskeletal:        General: Normal range of motion.     Cervical back: Normal range of motion and neck supple.  Skin:    General: Skin is warm and dry.  Neurological:     Mental Status: She is alert and oriented to person, place, and time.     Cranial Nerves: No cranial nerve deficit.  Psychiatric:        Mood and Affect: Mood normal.        Behavior: Behavior normal.        Thought Content: Thought content normal.        Judgment: Judgment normal.    No results found for any visits on 03/03/22.     Assessment & Plan:    Routine Health Maintenance and Physical Exam  Immunization History  Administered Date(s) Administered   Influenza,inj,Quad PF,6+ Mos 06/13/2019, 07/05/2020, 07/12/2021   PFIZER(Purple Top)SARS-COV-2 Vaccination 11/29/2019, 12/20/2019, 11/10/2020    Health Maintenance  Topic Date Due   INFLUENZA VACCINE  04/25/2022   PAP  SMEAR-Modifier  11/29/2024   TETANUS/TDAP  10/30/2025   HIV Screening  Completed   HPV VACCINES  Aged Out   COVID-19 Vaccine  Discontinued   Hepatitis C Screening  Discontinued    Discussed health benefits of physical activity, and encouraged her to engage in regular exercise appropriate for her age and condition.  Problem List Items Addressed This Visit       Other   Annual physical exam - Primary  Checking blood counts, electrolytes, and kidney/liver function.  Up-to-date on preventative care and followed closely by endocrinology.  Wellness information provided with AVS.      Relevant Orders   Lipid panel   COMPLETE METABOLIC PANEL WITH GFR   CBC with Differential/Platelet   Return in about 1 year (around 03/04/2023) for annual physical exam.   ___________________________________________ Clearnce Sorrel, DNP, APRN, FNP-BC Primary Care and [place]

## 2022-03-03 NOTE — Assessment & Plan Note (Signed)
Checking blood counts, electrolytes, and kidney/liver function.  Up-to-date on preventative care and followed closely by endocrinology.  Wellness information provided with AVS.

## 2022-03-04 LAB — COMPLETE METABOLIC PANEL WITH GFR
AG Ratio: 1.6 (calc) (ref 1.0–2.5)
ALT: 20 U/L (ref 6–29)
AST: 16 U/L (ref 10–30)
Albumin: 4.5 g/dL (ref 3.6–5.1)
Alkaline phosphatase (APISO): 86 U/L (ref 31–125)
BUN: 11 mg/dL (ref 7–25)
CO2: 26 mmol/L (ref 20–32)
Calcium: 9.6 mg/dL (ref 8.6–10.2)
Chloride: 103 mmol/L (ref 98–110)
Creat: 0.78 mg/dL (ref 0.50–0.97)
Globulin: 2.8 g/dL (calc) (ref 1.9–3.7)
Glucose, Bld: 92 mg/dL (ref 65–139)
Potassium: 4.3 mmol/L (ref 3.5–5.3)
Sodium: 137 mmol/L (ref 135–146)
Total Bilirubin: 0.6 mg/dL (ref 0.2–1.2)
Total Protein: 7.3 g/dL (ref 6.1–8.1)
eGFR: 99 mL/min/{1.73_m2} (ref >=60)

## 2022-03-04 LAB — CBC WITH DIFFERENTIAL/PLATELET
Absolute Monocytes: 559 cells/uL (ref 200–950)
Basophils Absolute: 41 cells/uL (ref 0–200)
Basophils Relative: 0.5 %
Eosinophils Absolute: 81 cells/uL (ref 15–500)
Eosinophils Relative: 1 %
HCT: 41.3 % (ref 35.0–45.0)
Hemoglobin: 13.4 g/dL (ref 11.7–15.5)
Lymphs Abs: 1288 cells/uL (ref 850–3900)
MCH: 26.9 pg — ABNORMAL LOW (ref 27.0–33.0)
MCHC: 32.4 g/dL (ref 32.0–36.0)
MCV: 82.8 fL (ref 80.0–100.0)
MPV: 9.3 fL (ref 7.5–12.5)
Monocytes Relative: 6.9 %
Neutro Abs: 6132 cells/uL (ref 1500–7800)
Neutrophils Relative %: 75.7 %
Platelets: 364 10*3/uL (ref 140–400)
RBC: 4.99 10*6/uL (ref 3.80–5.10)
RDW: 13.6 % (ref 11.0–15.0)
Total Lymphocyte: 15.9 %
WBC: 8.1 10*3/uL (ref 3.8–10.8)

## 2022-03-04 LAB — LIPID PANEL
Cholesterol: 206 mg/dL — ABNORMAL HIGH (ref <200)
HDL: 47 mg/dL — ABNORMAL LOW (ref >=50)
LDL Cholesterol (Calc): 126 mg/dL (calc) — ABNORMAL HIGH
Non-HDL Cholesterol (Calc): 159 mg/dL (calc) — ABNORMAL HIGH (ref <130)
Total CHOL/HDL Ratio: 4.4 (calc) (ref <5.0)
Triglycerides: 221 mg/dL — ABNORMAL HIGH (ref <150)

## 2022-03-06 ENCOUNTER — Other Ambulatory Visit (INDEPENDENT_AMBULATORY_CARE_PROVIDER_SITE_OTHER): Payer: No Typology Code available for payment source

## 2022-03-06 DIAGNOSIS — E063 Autoimmune thyroiditis: Secondary | ICD-10-CM | POA: Diagnosis not present

## 2022-03-07 LAB — TSH: TSH: 2.62 u[IU]/mL (ref 0.35–5.50)

## 2022-03-08 ENCOUNTER — Encounter: Payer: Self-pay | Admitting: Internal Medicine

## 2022-03-29 ENCOUNTER — Encounter: Payer: Self-pay | Admitting: Sports Medicine

## 2022-03-29 ENCOUNTER — Ambulatory Visit: Payer: Self-pay

## 2022-03-29 ENCOUNTER — Ambulatory Visit: Payer: No Typology Code available for payment source | Admitting: Sports Medicine

## 2022-03-29 DIAGNOSIS — S86311A Strain of muscle(s) and tendon(s) of peroneal muscle group at lower leg level, right leg, initial encounter: Secondary | ICD-10-CM

## 2022-03-29 DIAGNOSIS — M25571 Pain in right ankle and joints of right foot: Secondary | ICD-10-CM

## 2022-03-29 NOTE — Assessment & Plan Note (Signed)
Status post peroneal repair with Dr. Lucia Gaskins in the past, we did a lateral wedge, home peroneal conditioning, and she is for the most part pain is now over the peroneal tendon sheath.

## 2022-03-29 NOTE — Progress Notes (Signed)
    Procedures performed today:    Procedure: Real-time Ultrasound Guided injection of the right ankle joint Device: Samsung HS60  Verbal informed consent obtained.  Time-out conducted.  Noted no overlying erythema, induration, or other signs of local infection.  Skin prepped in a sterile fashion.  Local anesthesia: Topical Ethyl chloride.  With sterile technique and under real time ultrasound guidance: Noted normal-appearing joint, 1 cc Kenalog 40, 1 cc lidocaine, 1 cc bupivacaine injected easily Completed without difficulty  Advised to call if fevers/chills, erythema, induration, drainage, or persistent bleeding.  Images permanently stored and available for review in PACS.  Impression: Technically successful ultrasound guided injection.  Independent interpretation of notes and tests performed by another provider:   None.  Brief History, Exam, Impression, and Recommendations:    Right ankle pain Anita Bryant is a very pleasant 41 year old female, she has a peroneal tendon repair from Dr. Lucia Gaskins from sometime ago, at the last visit she had more pain referable to the peroneals, today she does not have any pain referable to the peroneals after about a month a lateral wedge. Pain today is predominantly at the sinus tarsi, as well as the lateral tibiotalar joint. Today we injected Bryant lateral tibiotalar joint, considering chronicity and persistence of pain for over 6 weeks in spite of conservative treatment, immobilization, NSAIDs, and injection we are proceeding with right ankle MRI.  Tear of peroneal tendon of right foot Status post peroneal repair with Dr. Lucia Gaskins in the past, we did a lateral wedge, home peroneal conditioning, and she is for the most part pain is now over the peroneal tendon sheath.    ____________________________________________ Anita Bryant. Anita Bryant, M.D., ABFM., CAQSM., AME. Primary Care and Sports Medicine [facility]  Adjunct Professor of  [place] of [facility] of Medicine  Risk manager

## 2022-03-29 NOTE — Assessment & Plan Note (Signed)
Anita Bryant is a very pleasant 41 year old female, she has a peroneal tendon repair from Dr. Lucia Gaskins from sometime ago, at the last visit she had more pain referable to the peroneals, today she does not have any pain referable to the peroneals after about a month a lateral wedge. Pain today is predominantly at the sinus tarsi, as well as the lateral tibiotalar joint. Today we injected her lateral tibiotalar joint, considering chronicity and persistence of pain for over 6 weeks in spite of conservative treatment, immobilization, NSAIDs, and injection we are proceeding with right ankle MRI.

## 2022-04-04 ENCOUNTER — Ambulatory Visit (INDEPENDENT_AMBULATORY_CARE_PROVIDER_SITE_OTHER): Payer: No Typology Code available for payment source

## 2022-04-04 DIAGNOSIS — M25571 Pain in right ankle and joints of right foot: Secondary | ICD-10-CM

## 2022-04-10 ENCOUNTER — Encounter: Payer: Self-pay | Admitting: Sports Medicine

## 2022-04-11 ENCOUNTER — Encounter: Payer: Self-pay | Admitting: Sports Medicine

## 2022-04-14 ENCOUNTER — Ambulatory Visit: Payer: No Typology Code available for payment source | Admitting: Sports Medicine

## 2022-04-14 ENCOUNTER — Ambulatory Visit (INDEPENDENT_AMBULATORY_CARE_PROVIDER_SITE_OTHER): Payer: No Typology Code available for payment source

## 2022-04-14 DIAGNOSIS — M5459 Other low back pain: Secondary | ICD-10-CM

## 2022-04-14 DIAGNOSIS — M5136 Other intervertebral disc degeneration, lumbar region with discogenic back pain only: Secondary | ICD-10-CM | POA: Insufficient documentation

## 2022-04-14 MED ORDER — HYDROCODONE-ACETAMINOPHEN 10-325 MG PO TABS: 1.0000 | ORAL_TABLET | Freq: Three times a day (TID) | ORAL | 0 refills | Status: DC | PRN

## 2022-04-14 MED ORDER — PREDNISONE 50 MG PO TABS
ORAL_TABLET | ORAL | 0 refills | Status: DC
Start: 2022-04-14 — End: 2022-05-10

## 2022-04-14 MED ORDER — KETOROLAC TROMETHAMINE 60 MG/2ML IM SOLN
60.0000 mg | Freq: Once | INTRAMUSCULAR | Status: AC
  Administered 2022-04-14: 60 mg via INTRAMUSCULAR

## 2022-04-14 NOTE — Progress Notes (Signed)
    Procedures performed today:    None.  Independent interpretation of notes and tests performed by another provider:   None.  Brief History, Exam, Impression, and Recommendations:    Discogenic lumbar pain Pleasant 41 year old female, about a week of severe pain low back with radiation down the right hip, and anterior thigh to the anterior knee but not past. No red flag symptoms. Pain is worse with sitting, flexion, Valsalva consistent with a discogenic pain. She did feel a pop at the onset of pain which was likely an annular tear. Toradol 60 intramuscular today, adding 5 days of steroids, followed by meloxicam, x-rays, as well as hydrocodone for pain relief. Home physical therapy given, we did discuss the anatomy and pathophysiology of discogenic back pain. Return to see me in 6 weeks.    ____________________________________________ Gwen Her. Dianah Field, M.D., ABFM., CAQSM., AME. Primary Care and Sports Medicine [facility]  Adjunct Professor of [place] of [facility] of Medicine  Risk manager

## 2022-04-14 NOTE — Assessment & Plan Note (Signed)
Pleasant 41 year old female, about a week of severe pain low back with radiation down the right hip, and anterior thigh to the anterior knee but not past. No red flag symptoms. Pain is worse with sitting, flexion, Valsalva consistent with a discogenic pain. She did feel a pop at the onset of pain which was likely an annular tear. Toradol 60 intramuscular today, adding 5 days of steroids, followed by meloxicam, x-rays, as well as hydrocodone for pain relief. Home physical therapy given, we did discuss the anatomy and pathophysiology of discogenic back pain. Return to see me in 6 weeks.

## 2022-04-17 ENCOUNTER — Encounter: Payer: Self-pay | Admitting: Sports Medicine

## 2022-04-26 ENCOUNTER — Ambulatory Visit (HOSPITAL_COMMUNITY): Admit: 2022-04-26 | Payer: No Typology Code available for payment source | Admitting: Gastroenterology

## 2022-04-26 ENCOUNTER — Encounter (HOSPITAL_COMMUNITY): Payer: Self-pay

## 2022-04-26 SURGERY — MANOMETRY, ESOPHAGUS

## 2022-05-10 ENCOUNTER — Ambulatory Visit: Payer: No Typology Code available for payment source | Admitting: Sports Medicine

## 2022-05-10 DIAGNOSIS — M25571 Pain in right ankle and joints of right foot: Secondary | ICD-10-CM

## 2022-05-10 DIAGNOSIS — M5459 Other low back pain: Secondary | ICD-10-CM

## 2022-05-10 DIAGNOSIS — M5136 Other intervertebral disc degeneration, lumbar region with discogenic back pain only: Secondary | ICD-10-CM

## 2022-05-10 MED ORDER — MELOXICAM 15 MG PO TABS: ORAL_TABLET | ORAL | 3 refills | Status: AC

## 2022-05-10 NOTE — Progress Notes (Signed)
    Procedures performed today:    None.  Independent interpretation of notes and tests performed by another provider:   None.  Brief History, Exam, Impression, and Recommendations:    Discogenic lumbar pain Anita Bryant returns, she is a very pleasant 41-year-old female, we treated her for discogenic back pain with steroids, Toradol, she did really well, she is not taking any medications now. She still has some pain and feels though her recovery has plateaued. Restart meloxicam, work harder on the conditioning, she does understand that symptoms like these likely secondary to an annular tear can take 3 months sometimes to get better. We can follow this up in 6 weeks and discuss MRI for interventional planning if not better at the follow-up.  Right ankle pain Anita Bryant continues to have right ankle pain and swelling, we did a tibiotalar joint injection back early last month, she had some improvement but now is starting to have some worsening again, she is complaining of some discomfort behind the malleolus laterally, she is post peroneal tendon repair from Dr. Lucia Gaskins sometime ago. She is interested in getting back and ice-skating, I have advised that she take the meloxicam as above, wear a thick sock followed by lace up ankle brace, she can lace up to her ice skate relatively loosely over the top of the ankle and then somewhat more tightly above the ankle. Return to see me in 6 weeks and if persistent discomfort we will consider injection of the peroneal tendon sheath.  Chronic process not at goal with pharmacologic intervention  ____________________________________________ Gwen Her. Dianah Field, M.D., ABFM., CAQSM., AME. Primary Care and Sports Medicine [facility]  Adjunct Professor of [place] of [facility] of Medicine  Risk manager

## 2022-05-10 NOTE — Assessment & Plan Note (Signed)
Anita Bryant returns, she is a very pleasant 41-year-old female, we treated her for discogenic back pain with steroids, Toradol, she did really well, she is not taking any medications now. She still has some pain and feels though her recovery has plateaued. Restart meloxicam, work harder on the conditioning, she does understand that symptoms like these likely secondary to an annular tear can take 3 months sometimes to get better. We can follow this up in 6 weeks and discuss MRI for interventional planning if not better at the follow-up.

## 2022-05-10 NOTE — Assessment & Plan Note (Signed)
Anita Bryant continues to have right ankle pain and swelling, we did a tibiotalar joint injection back early last month, she had some improvement but now is starting to have some worsening again, she is complaining of some discomfort behind the malleolus laterally, she is post peroneal tendon repair from Dr. Lucia Gaskins sometime ago. She is interested in getting back and ice-skating, I have advised that she take the meloxicam as above, wear a thick sock followed by lace up ankle brace, she can lace up to her ice skate relatively loosely over the top of the ankle and then somewhat more tightly above the ankle. Return to see me in 6 weeks and if persistent discomfort we will consider injection of the peroneal tendon sheath.

## 2022-05-30 ENCOUNTER — Encounter (HOSPITAL_BASED_OUTPATIENT_CLINIC_OR_DEPARTMENT_OTHER): Payer: Self-pay

## 2022-05-30 ENCOUNTER — Ambulatory Visit (HOSPITAL_BASED_OUTPATIENT_CLINIC_OR_DEPARTMENT_OTHER)
Admission: RE | Admit: 2022-05-30 | Discharge: 2022-05-30 | Disposition: A | Discharge status: Home / Self Care | Payer: No Typology Code available for payment source | Source: Ambulatory Visit

## 2022-05-30 DIAGNOSIS — Z01419 Encounter for gynecological examination (general) (routine) without abnormal findings: Secondary | ICD-10-CM | POA: Insufficient documentation

## 2022-05-30 DIAGNOSIS — Z1231 Encounter for screening mammogram for malignant neoplasm of breast: Secondary | ICD-10-CM | POA: Diagnosis not present

## 2022-05-30 DIAGNOSIS — Z1239 Encounter for other screening for malignant neoplasm of breast: Secondary | ICD-10-CM

## 2022-06-09 ENCOUNTER — Other Ambulatory Visit: Payer: Self-pay | Admitting: Internal Medicine

## 2022-06-19 ENCOUNTER — Ambulatory Visit: Payer: No Typology Code available for payment source | Admitting: Sports Medicine

## 2022-06-19 ENCOUNTER — Ambulatory Visit (INDEPENDENT_AMBULATORY_CARE_PROVIDER_SITE_OTHER): Payer: No Typology Code available for payment source

## 2022-06-19 DIAGNOSIS — M5459 Other low back pain: Secondary | ICD-10-CM | POA: Diagnosis not present

## 2022-06-19 DIAGNOSIS — Z23 Encounter for immunization: Secondary | ICD-10-CM | POA: Diagnosis not present

## 2022-06-19 DIAGNOSIS — S86311A Strain of muscle(s) and tendon(s) of peroneal muscle group at lower leg level, right leg, initial encounter: Secondary | ICD-10-CM

## 2022-06-19 DIAGNOSIS — M5136 Other intervertebral disc degeneration, lumbar region with discogenic back pain only: Secondary | ICD-10-CM

## 2022-06-19 NOTE — Progress Notes (Signed)
    Procedures performed today:    Procedure: Real-time Ultrasound Guided injection of the right peroneal sheath Device: Samsung HS60  Verbal informed consent obtained.  Time-out conducted.  Noted no overlying erythema, induration, or other signs of local infection.  Skin prepped in a sterile fashion.  Local anesthesia: Topical Ethyl chloride.  With sterile technique and under real time ultrasound guidance: Mild peroneal sheath effusion noted, 1 cc Kenalog 40, 1 cc lidocaine, 1 cc bupivacaine injected easily Completed without difficulty  Advised to call if fevers/chills, erythema, induration, drainage, or persistent bleeding.  Images permanently stored and available for review in PACS.  Impression: Technically successful ultrasound guided injection.  Independent interpretation of notes and tests performed by another provider:   None.  Brief History, Exam, Impression, and Recommendations:    Tear of peroneal tendon of right foot Status post perineal repair with Dr. Lucia Gaskins in the past, lateral wedge, home conditioning has so far not been effective, today we did a peroneal sheath injection, return to see me in 4 to 6 weeks for this.  Discogenic lumbar pain At this point [name] has also failed greater than 6 weeks of conservative treatment for discogenic back pain, she is having some right dorsal foot radicular symptoms as well, at this point we will proceed with MRI for epidural planning.    ____________________________________________ Gwen Her. Dianah Field, M.D., ABFM., CAQSM., AME. Primary Care and Sports Medicine [facility]  Adjunct Professor of [place] of [facility] of Medicine  Risk manager

## 2022-06-19 NOTE — Assessment & Plan Note (Signed)
At this point Anita Bryant has also failed greater than 6 weeks of conservative treatment for discogenic back pain, she is having some right dorsal foot radicular symptoms as well, at this point we will proceed with MRI for epidural planning.

## 2022-06-19 NOTE — Assessment & Plan Note (Signed)
Status post perineal repair with Dr. Lucia Gaskins in the past, lateral wedge, home conditioning has so far not been effective, today we did a peroneal sheath injection, return to see me in 4 to 6 weeks for this.

## 2022-07-11 ENCOUNTER — Ambulatory Visit (INDEPENDENT_AMBULATORY_CARE_PROVIDER_SITE_OTHER): Payer: No Typology Code available for payment source

## 2022-07-11 DIAGNOSIS — M5459 Other low back pain: Secondary | ICD-10-CM

## 2022-07-17 ENCOUNTER — Encounter: Payer: Self-pay | Admitting: Sports Medicine

## 2022-07-17 DIAGNOSIS — M5459 Other low back pain: Secondary | ICD-10-CM

## 2022-07-17 DIAGNOSIS — M5136 Other intervertebral disc degeneration, lumbar region with discogenic back pain only: Secondary | ICD-10-CM

## 2022-07-28 ENCOUNTER — Ambulatory Visit
Admission: EM | Admit: 2022-07-28 | Discharge: 2022-07-28 | Disposition: A | Discharge status: Home / Self Care | Payer: No Typology Code available for payment source | Attending: Family Medicine | Admitting: Family Medicine

## 2022-07-28 ENCOUNTER — Encounter: Payer: Self-pay | Admitting: Emergency Medicine

## 2022-07-28 DIAGNOSIS — J029 Acute pharyngitis, unspecified: Secondary | ICD-10-CM | POA: Diagnosis present

## 2022-07-28 LAB — POCT RAPID STREP A (OFFICE): Rapid Strep A Screen: NEGATIVE

## 2022-07-28 MED ORDER — AZITHROMYCIN 250 MG PO TABS: 250.0000 mg | ORAL_TABLET | Freq: Every day | ORAL | 0 refills | Status: DC

## 2022-07-28 NOTE — ED Triage Notes (Signed)
Patient c/o sore throat x 1 day, body aches, congestion, worse through out the day.  Patient has taken Dayquil and Nyquil.  States that her throat is on fire.

## 2022-07-28 NOTE — Discharge Instructions (Addendum)
Instructed patient to take medication as directed with food to completion.  Encouraged patient to increase daily water intake to 64 ounces per day while taking this medication.  Advised patient if symptoms worsen and/or unresolved please follow-up PCP or here for further evaluation.

## 2022-07-28 NOTE — ED Provider Notes (Signed)
[name]    CSN: [id number] Arrival date & time: 07/28/22  0906      History   Chief Complaint Chief Complaint  Patient presents with   Sore Throat    HPI Anita Bryant is a 41 y.o. female.   HPI 41 year old female presents with sore throat for 1 day, body aches, congestion worse throughout the day.  Reports taking OTC DayQuil/NyQuil.  Patient reports that her throat is on fire.  PMH significant for obesity, asthma, and history of nephrolithiasis.  Patient reports her throat pain is currently 8 of 10 and she feels like she is swallowing pieces of glass.  Past Medical History:  Diagnosis Date   Asthma    GERD (gastroesophageal reflux disease)    Hashimoto's disease    History of nephrolithiasis     Patient Active Problem List   Diagnosis Date Noted   Discogenic lumbar pain 04/14/2022   Annual physical exam 03/03/2022   Cervical paraspinous muscle spasm 02/14/2022   Tear of peroneal tendon of right foot 02/14/2022   Hashimoto's disease 05/26/2020   Vitamin D deficiency 05/26/2020   Right ankle pain 05/25/2020   Subclinical hypothyroidism 05/10/2020   Mild intermittent asthma without complication 69/67/8938   Family history of cancer in father 05/10/2020   Asthma with acute exacerbation 11/07/2018   Deviated nasal septum 10/30/2018   Chronic rhinitis 10/30/2018   Carpal tunnel syndrome on left 10/30/2018   Family history of thyroid disease in mother 10/30/2018   History of nephrolithiasis     Past Surgical History:  Procedure Laterality Date   ANKLE SURGERY Right    CARPAL TUNNEL WITH CUBITAL TUNNEL     KIDNEY STONE SURGERY     UPPER GASTROINTESTINAL ENDOSCOPY      OB History     Gravida  0   Para  0   Term  0   Preterm  0   AB  0   Living  0      SAB  0   IAB  0   Ectopic  0   Multiple  0   Live Births  0            Home Medications    Prior to Admission medications   Medication Sig Start Date End Date Taking?  Authorizing Provider  acetaminophen (TYLENOL) 325 MG tablet Take 650 mg by mouth every 4 (four) hours as needed.   Yes [provider]  albuterol (VENTOLIN HFA) 108 (90 Base) MCG/ACT inhaler Inhale 1-2 puffs into the lungs every 4 (four) hours as needed for wheezing or shortness of breath. 03/03/22  Yes Samuel Bouche, NP  azithromycin (ZITHROMAX) 250 MG tablet Take 1 tablet (250 mg total) by mouth daily. Take first 2 tablets together, then 1 every day until finished. 07/28/22  Yes Eliezer Lofts, FNP  clobetasol cream (TEMOVATE) 1.01 % Apply 1 application topically 2 (two) times daily as needed. 05/10/20  Yes Samuel Bouche, NP  esomeprazole (NEXIUM) 40 MG capsule Take 1 capsule (40 mg total) by mouth daily. 40 mg BID for 6 weeks then reduce to 40 mg daily. 01/05/22  Yes Cirigliano, Vito V, DO  levothyroxine (SYNTHROID) 25 MCG tablet TAKE 1 TABLET BY MOUTH ONCE DAILY BEFORE BREAKFAST 06/09/22  Yes Shamleffer, Melanie Crazier, MD  meloxicam (MOBIC) 15 MG tablet One tab PO every 24 hours with a meal for 2 weeks, then once every 24 hours prn pain. 05/10/22  Yes Silverio Decamp, MD  Prenatal Vit-Fe  Fumarate-FA (PRENATAL ONE DAILY) 27-0.8 MG TABS Take 1 tablet by mouth daily. 10/30/18  Yes Trixie Dredge, PA-C    Family History Family History  Problem Relation Age of Onset   Thyroid disease Mother    Cancer Father        MDS cancer, Swed's disease   Colon cancer Maternal Great-grandfather    Esophageal cancer Neg Hx    Rectal cancer Neg Hx    Liver disease Neg Hx    Pancreatic cancer Neg Hx     Social History Social History   Tobacco Use   Smoking status: Never   Smokeless tobacco: Never  Vaping Use   Vaping Use: Never used  Substance Use Topics   Alcohol use: Never   Drug use: Never     Allergies   Cefdinir and Decadron [dexamethasone]   Review of Systems Review of Systems  HENT:  Positive for congestion and sore throat.   Musculoskeletal:  Positive for  myalgias.  All other systems reviewed and are negative.    Physical Exam Triage Vital Signs ED Triage Vitals  Enc Vitals Group     BP 07/28/22 0924 139/79     Pulse Rate 07/28/22 0924 81     Resp 07/28/22 0924 18     Temp 07/28/22 0924 99.1 F (37.3 C)     Temp Source 07/28/22 0924 Oral     SpO2 07/28/22 0924 100 %     Weight 07/28/22 0926 170 lb (77.1 kg)     Height 07/28/22 0926 '5\' 1"'$  (1.549 m)     Head Circumference --      Peak Flow --      Pain Score 07/28/22 0925 8     Pain Loc --      Pain Edu? --      Excl. in [place]? --    No data found.  Updated Vital Signs BP 139/79 (BP Location: Right Arm)   Pulse 81   Temp 99.1 F (37.3 C) (Oral)   Resp 18   Ht '5\' 1"'$  (1.549 m)   Wt 170 lb (77.1 kg)   SpO2 100%   BMI 32.12 kg/m   Physical Exam Vitals and nursing note reviewed.  Constitutional:      General: She is not in acute distress.    Appearance: Normal appearance. She is well-developed. She is obese. She is not ill-appearing.  HENT:     Head: Normocephalic and atraumatic.     Right Ear: Tympanic membrane, ear canal and external ear normal.     Left Ear: Tympanic membrane, ear canal and external ear normal.     Mouth/Throat:     Mouth: Mucous membranes are moist.     Pharynx: Oropharynx is clear. Uvula midline. Posterior oropharyngeal erythema present.  Eyes:     Conjunctiva/sclera: Conjunctivae normal.     Pupils: Pupils are equal, round, and reactive to light.  Cardiovascular:     Rate and Rhythm: Normal rate and regular rhythm.     Pulses: Normal pulses.     Heart sounds: Normal heart sounds.  Pulmonary:     Effort: Pulmonary effort is normal.     Breath sounds: Normal breath sounds.     Comments: No adventitious breath sounds noted Abdominal:     General: Bowel sounds are normal.     Palpations: Abdomen is soft.  Musculoskeletal:     Cervical back: Normal range of motion and neck supple. Tenderness present.  Lymphadenopathy:  Cervical: No  cervical adenopathy.  Skin:    General: Skin is warm and dry.  Neurological:     General: No focal deficit present.     Mental Status: She is alert and oriented to person, place, and time.      UC Treatments / Results  Labs (all labs ordered are listed, but only abnormal results are displayed) Labs Reviewed  CULTURE, GROUP A STREP [facility])  POCT RAPID STREP A (OFFICE)    EKG   Radiology No results found.  Procedures Procedures (including critical care time)  Medications Ordered in UC Medications - No data to display  Initial Impression / Assessment and Plan / UC Course  I have reviewed the triage vital signs and the nursing notes.  Pertinent labs & imaging results that were available during my care of the patient were reviewed by me and considered in my medical decision making (see chart for details).     MDM: 1.  Pharyngitis-Rx'd Zithromax. Instructed patient to take medication as directed with food to completion.  Encouraged patient to increase daily water intake to 64 ounces per day while taking this medication.  Advised patient if symptoms worsen and/or unresolved please follow-up PCP or here for further evaluation. Final Clinical Impressions(s) / UC Diagnoses   Final diagnoses:  Pharyngitis, unspecified etiology     Discharge Instructions      Instructed patient to take medication as directed with food to completion.  Encouraged patient to increase daily water intake to 64 ounces per day while taking this medication.  Advised patient if symptoms worsen and/or unresolved please follow-up PCP or here for further evaluation.     ED Prescriptions     Medication Sig Dispense Auth. Provider   azithromycin (ZITHROMAX) 250 MG tablet Take 1 tablet (250 mg total) by mouth daily. Take first 2 tablets together, then 1 every day until finished. 6 tablet Eliezer Lofts, FNP      PDMP not reviewed this encounter.   Eliezer Lofts, [place] 07/28/22 1014

## 2022-07-30 LAB — CULTURE, GROUP A STREP (THRC)

## 2022-07-31 ENCOUNTER — Telehealth: Payer: Self-pay | Admitting: Sports Medicine

## 2022-07-31 ENCOUNTER — Ambulatory Visit
Admission: EM | Admit: 2022-07-31 | Discharge: 2022-07-31 | Disposition: A | Discharge status: Home / Self Care | Payer: No Typology Code available for payment source | Attending: Family Medicine | Admitting: Family Medicine

## 2022-07-31 ENCOUNTER — Inpatient Hospital Stay: Admission: RE | Admit: 2022-07-31 | Payer: No Typology Code available for payment source | Source: Ambulatory Visit

## 2022-07-31 ENCOUNTER — Telehealth: Payer: Self-pay

## 2022-07-31 DIAGNOSIS — J111 Influenza due to unidentified influenza virus with other respiratory manifestations: Secondary | ICD-10-CM | POA: Diagnosis not present

## 2022-07-31 DIAGNOSIS — J4531 Mild persistent asthma with (acute) exacerbation: Secondary | ICD-10-CM

## 2022-07-31 DIAGNOSIS — R051 Acute cough: Secondary | ICD-10-CM

## 2022-07-31 LAB — POCT INFLUENZA A/B
Influenza A, POC: NEGATIVE
Influenza B, POC: NEGATIVE

## 2022-07-31 LAB — POC SARS CORONAVIRUS 2 AG -  ED: SARS Coronavirus 2 Ag: NEGATIVE

## 2022-07-31 MED ORDER — BENZONATATE 200 MG PO CAPS: 200.0000 mg | ORAL_CAPSULE | Freq: Three times a day (TID) | ORAL | 0 refills | Status: DC | PRN

## 2022-07-31 MED ORDER — PREDNISONE 50 MG PO TABS: 50.0000 mg | ORAL_TABLET | Freq: Every day | ORAL | 0 refills | Status: DC

## 2022-07-31 MED ORDER — ACETAMINOPHEN 325 MG PO TABS
650.0000 mg | ORAL_TABLET | Freq: Once | ORAL | Status: AC
  Administered 2022-07-31: 650 mg via ORAL

## 2022-07-31 MED ORDER — HYDROCOD POLI-CHLORPHE POLI ER 10-8 MG/5ML PO SUER: 5.0000 mL | Freq: Two times a day (BID) | ORAL | 0 refills | Status: DC | PRN

## 2022-07-31 NOTE — Telephone Encounter (Signed)
Patient requests cough medicine.  Anita Bryant called in for her

## 2022-07-31 NOTE — ED Triage Notes (Signed)
Pt presents with c/o continued cough, chills, and fever that began last Thursday.

## 2022-07-31 NOTE — Discharge Instructions (Signed)
Drink a lot of water Take Tussionex 2 times a day for cough May take Tessalon in addition if needed Run a humidifier if you have 1 available Call your doctor if not improving towards the end of the week

## 2022-07-31 NOTE — Assessment & Plan Note (Signed)
Pleasant 41 yo female, hx asthma, day 4 febrile illness with negative flu, covid, strep testing.  Has severe cough and some SOB.  Has had azithro and antitussives. UC visits x2.  Considering hx asthma I do think we should proceed with steroids and a CXR.  Holding off on additional antibiotics for now.  Continue antitussives for symptomatic relief.  Followup end of the week at least by virtual visit.

## 2022-07-31 NOTE — ED Provider Notes (Signed)
[name]    CSN: [id number] Arrival date & time: 07/31/22  1404      History   Chief Complaint Chief Complaint  Patient presents with   Shortness of Breath   Cough    HPI Anita Bryant is a 41 y.o. female.   HPI Patient was seen here on 07/28/2022 for sore throat, cough, fever.  She was given a Z-Pak.  Strep test was negative.  She has progressively gotten worse.  She is now on day 4 of illness and still has a fever of 102.7.  Headache and body aches.  Weakness.  Coughing that is unremitting.  Sore throat.  No viral testing is done  Past Medical History:  Diagnosis Date   Asthma    GERD (gastroesophageal reflux disease)    Hashimoto's disease    History of nephrolithiasis     Patient Active Problem List   Diagnosis Date Noted   Discogenic lumbar pain 04/14/2022   Annual physical exam 03/03/2022   Cervical paraspinous muscle spasm 02/14/2022   Tear of peroneal tendon of right foot 02/14/2022   Hashimoto's disease 05/26/2020   Vitamin D deficiency 05/26/2020   Right ankle pain 05/25/2020   Subclinical hypothyroidism 05/10/2020   Mild intermittent asthma without complication 09/27/7251   Family history of cancer in father 05/10/2020   Asthma with acute exacerbation 11/07/2018   Deviated nasal septum 10/30/2018   Chronic rhinitis 10/30/2018   Carpal tunnel syndrome on left 10/30/2018   Family history of thyroid disease in mother 10/30/2018   History of nephrolithiasis     Past Surgical History:  Procedure Laterality Date   ANKLE SURGERY Right    CARPAL TUNNEL WITH CUBITAL TUNNEL     KIDNEY STONE SURGERY     UPPER GASTROINTESTINAL ENDOSCOPY      OB History     Gravida  0   Para  0   Term  0   Preterm  0   AB  0   Living  0      SAB  0   IAB  0   Ectopic  0   Multiple  0   Live Births  0            Home Medications    Prior to Admission medications   Medication Sig Start Date End Date Taking? Authorizing Provider   chlorpheniramine-HYDROcodone (TUSSIONEX) 10-8 MG/5ML Take 5 mLs by mouth every 12 (twelve) hours as needed for cough. 07/31/22  Yes Raylene Everts, MD  guaiFENesin (MUCINEX) 600 MG 12 hr tablet Take by mouth 2 (two) times daily.   Yes [provider]  acetaminophen (TYLENOL) 325 MG tablet Take 650 mg by mouth every 4 (four) hours as needed.    [provider]  albuterol (VENTOLIN HFA) 108 (90 Base) MCG/ACT inhaler Inhale 1-2 puffs into the lungs every 4 (four) hours as needed for wheezing or shortness of breath. 03/03/22   Samuel Bouche, NP  azithromycin (ZITHROMAX) 250 MG tablet Take 1 tablet (250 mg total) by mouth daily. Take first 2 tablets together, then 1 every day until finished. 07/28/22   Eliezer Lofts, FNP  benzonatate (TESSALON) 200 MG capsule Take 1 capsule (200 mg total) by mouth 3 (three) times daily as needed for cough. 07/31/22   Raylene Everts, MD  clobetasol cream (TEMOVATE) 6.64 % Apply 1 application topically 2 (two) times daily as needed. 05/10/20   Samuel Bouche, NP  esomeprazole (NEXIUM) 40 MG capsule Take 1  capsule (40 mg total) by mouth daily. 40 mg BID for 6 weeks then reduce to 40 mg daily. 01/05/22   Cirigliano, Vito V, DO  levothyroxine (SYNTHROID) 25 MCG tablet TAKE 1 TABLET BY MOUTH ONCE DAILY BEFORE BREAKFAST 06/09/22   Shamleffer, Melanie Crazier, MD  meloxicam (MOBIC) 15 MG tablet One tab PO every 24 hours with a meal for 2 weeks, then once every 24 hours prn pain. 05/10/22   Silverio Decamp, MD  Prenatal Vit-Fe Fumarate-FA (PRENATAL ONE DAILY) 27-0.8 MG TABS Take 1 tablet by mouth daily. 10/30/18   Trixie Dredge, PA-C    Family History Family History  Problem Relation Age of Onset   Thyroid disease Mother    Cancer Father        MDS cancer, Swed's disease   Colon cancer Maternal Great-grandfather    Esophageal cancer Neg Hx    Rectal cancer Neg Hx    Liver disease Neg Hx    Pancreatic cancer Neg Hx     Social  History Social History   Tobacco Use   Smoking status: Never   Smokeless tobacco: Never  Vaping Use   Vaping Use: Never used  Substance Use Topics   Alcohol use: Never   Drug use: Never     Allergies   Cefdinir and Decadron [dexamethasone]   Review of Systems Review of Systems  See HPI Physical Exam Triage Vital Signs ED Triage Vitals [07/31/22 1419]  Enc Vitals Group     BP 120/86     Pulse Rate (!) 109     Resp 18     Temp (!) 102.7 F (39.3 C)     Temp Source Oral     SpO2 96 %     Weight      Height      Head Circumference      Peak Flow      Pain Score      Pain Loc      Pain Edu?      Excl. in [place]?    No data found.  Updated Vital Signs BP 120/86 (BP Location: Left Arm)   Pulse (!) 109   Temp (!) 102.7 F (39.3 C) (Oral)   Resp 18   SpO2 96%   Physical Exam Vitals reviewed.  Constitutional:      General: She is not in acute distress.    Appearance: She is well-developed. She is obese. She is ill-appearing.  HENT:     Head: Normocephalic and atraumatic.  Eyes:     Conjunctiva/sclera: Conjunctivae normal.     Pupils: Pupils are equal, round, and reactive to light.  Cardiovascular:     Rate and Rhythm: Regular rhythm. Tachycardia present.     Heart sounds: Normal heart sounds.  Pulmonary:     Effort: Pulmonary effort is normal. Tachypnea present. No respiratory distress.     Breath sounds: Decreased breath sounds present.     Comments: Patient is breathing rapidly.  Unable to take deep breaths secondary to cough.  No abnormal breath sounds are auscultated Abdominal:     General: Bowel sounds are normal. There is no distension.     Palpations: Abdomen is soft.  Musculoskeletal:        General: Normal range of motion.     Cervical back: Normal range of motion.  Skin:    General: Skin is warm and dry.  Neurological:     Mental Status: She is alert.  UC Treatments / Results  Labs (all labs ordered are listed, but only abnormal  results are displayed) Labs Reviewed  POC SARS CORONAVIRUS 2 AG -  ED  POCT INFLUENZA A/B    EKG   Radiology No results found.  Procedures Procedures (including critical care time)  Medications Ordered in UC Medications  acetaminophen (TYLENOL) tablet 650 mg (650 mg Oral Given 07/31/22 1423)    Initial Impression / Assessment and Plan / UC Course  I have reviewed the triage vital signs and the nursing notes.  Pertinent labs & imaging results that were available during my care of the patient were reviewed by me and considered in my medical decision making (see chart for details).     Patient had a negative strep test.  Negative influenza screen.  Negative COVID test.  Discussed symptomatic treatment.  Reasons for return. Final Clinical Impressions(s) / UC Diagnoses   Final diagnoses:  Acute cough  Influenza-like illness     Discharge Instructions      Drink a lot of water Take Tussionex 2 times a day for cough May take Tessalon in addition if needed Run a humidifier if you have 1 available Call your doctor if not improving towards the end of the week      ED Prescriptions     Medication Sig Dispense Auth. Provider   chlorpheniramine-HYDROcodone (TUSSIONEX) 10-8 MG/5ML Take 5 mLs by mouth every 12 (twelve) hours as needed for cough. 115 mL Raylene Everts, MD      I have reviewed the PDMP during this encounter.   Raylene Everts, MD 07/31/22 [phone]

## 2022-07-31 NOTE — Telephone Encounter (Signed)
Pleasant 42 yo female, hx asthma, day 4 febrile illness with negative flu, covid, strep testing.  Has severe cough and some SOB.  Has had azithro and antitussives. UC visits x2.  Considering hx asthma I do think we should proceed with steroids and a CXR.  Holding off on additional antibiotics for now.  Continue antitussives for symptomatic relief.  Followup end of the week at least by virtual visit.

## 2022-08-01 ENCOUNTER — Ambulatory Visit (INDEPENDENT_AMBULATORY_CARE_PROVIDER_SITE_OTHER): Payer: No Typology Code available for payment source

## 2022-08-01 DIAGNOSIS — J4531 Mild persistent asthma with (acute) exacerbation: Secondary | ICD-10-CM

## 2022-08-07 ENCOUNTER — Other Ambulatory Visit: Payer: No Typology Code available for payment source

## 2022-08-21 ENCOUNTER — Ambulatory Visit
Admission: RE | Admit: 2022-08-21 | Discharge: 2022-08-21 | Disposition: A | Discharge status: Home / Self Care | Payer: No Typology Code available for payment source | Source: Ambulatory Visit | Attending: Sports Medicine | Admitting: Sports Medicine

## 2022-08-21 ENCOUNTER — Encounter: Payer: Self-pay | Admitting: Gastroenterology

## 2022-08-21 DIAGNOSIS — M5459 Other low back pain: Secondary | ICD-10-CM

## 2022-08-21 MED ORDER — METHYLPREDNISOLONE ACETATE 40 MG/ML INJ SUSP (RADIOLOG
80.0000 mg | Freq: Once | INTRAMUSCULAR | Status: AC
  Administered 2022-08-21: 80 mg via EPIDURAL

## 2022-08-21 MED ORDER — IOPAMIDOL (ISOVUE-M 200) INJECTION 41%
1.0000 mL | Freq: Once | INTRAMUSCULAR | Status: AC
  Administered 2022-08-21: 1 mL via EPIDURAL

## 2022-08-21 NOTE — Discharge Instructions (Signed)

## 2022-08-22 NOTE — Telephone Encounter (Signed)
In the absence of sxs, repeat EGD not needed for surveillance or screening purposes.  Can certainly schedule follow-up appointment with me in the GI clinic for ongoing management of GERD, particularly if symptomatic/problematic on therapy.

## 2022-09-10 ENCOUNTER — Other Ambulatory Visit: Payer: Self-pay | Admitting: Internal Medicine

## 2022-10-03 ENCOUNTER — Telehealth: Payer: No Typology Code available for payment source | Admitting: Physician Assistant

## 2022-10-03 DIAGNOSIS — B9689 Other specified bacterial agents as the cause of diseases classified elsewhere: Secondary | ICD-10-CM | POA: Diagnosis not present

## 2022-10-03 DIAGNOSIS — J019 Acute sinusitis, unspecified: Secondary | ICD-10-CM | POA: Diagnosis not present

## 2022-10-03 MED ORDER — DOXYCYCLINE HYCLATE 100 MG PO TABS: 100.0000 mg | ORAL_TABLET | Freq: Two times a day (BID) | ORAL | 0 refills | Status: DC

## 2022-10-03 NOTE — Progress Notes (Signed)
E-Visit for Sinus Problems  We are sorry that you are not feeling well.  Here is how we plan to help!  Based on what you have shared with me it looks like you have sinusitis.  Sinusitis is inflammation and infection in the sinus cavities of the head.  Based on your presentation I believe you most likely have Acute Bacterial Sinusitis.  This is an infection caused by bacteria and is treated with antibiotics. I have prescribed Doxycycline 100mg by mouth twice a day for 10 days. You may use an oral decongestant such as Mucinex D or if you have glaucoma or high blood pressure use plain Mucinex. Saline nasal spray help and can safely be used as often as needed for congestion.  If you develop worsening sinus pain, fever or notice severe headache and vision changes, or if symptoms are not better after completion of antibiotic, please schedule an appointment with a health care provider.    Sinus infections are not as easily transmitted as other respiratory infection, however we still recommend that you avoid close contact with loved ones, especially the very young and elderly.  Remember to wash your hands thoroughly throughout the day as this is the number one way to prevent the spread of infection!  Home Care: Only take medications as instructed by your medical team. Complete the entire course of an antibiotic. Do not take these medications with alcohol. A steam or ultrasonic humidifier can help congestion.  You can place a towel over your head and breathe in the steam from hot water coming from a faucet. Avoid close contacts especially the very young and the elderly. Cover your mouth when you cough or sneeze. Always remember to wash your hands.  Get Help Right Away If: You develop worsening fever or sinus pain. You develop a severe head ache or visual changes. Your symptoms persist after you have completed your treatment plan.  Make sure you Understand these instructions. Will watch your  condition. Will get help right away if you are not doing well or get worse.  Thank you for choosing an e-visit.  Your e-visit answers were reviewed by a board certified advanced clinical practitioner to complete your personal care plan. Depending upon the condition, your plan could have included both over the counter or prescription medications.  Please review your pharmacy choice. Make sure the pharmacy is open so you can pick up prescription now. If there is a problem, you may contact your provider through MyChart messaging and have the prescription routed to another pharmacy.  Your safety is important to us. If you have drug allergies check your prescription carefully.   For the next 24 hours you can use MyChart to ask questions about today's visit, request a non-urgent call back, or ask for a work or school excuse. You will get an email in the next two days asking about your experience. I hope that your e-visit has been valuable and will speed your recovery.  I have spent 5 minutes in review of e-visit questionnaire, review and updating patient chart, medical decision making and response to patient.   Meldon Hanzlik M Brinkley Peet, PA-C  

## 2022-10-19 ENCOUNTER — Encounter: Payer: Self-pay | Admitting: Sports Medicine

## 2022-12-12 ENCOUNTER — Telehealth: Payer: Self-pay

## 2022-12-12 ENCOUNTER — Other Ambulatory Visit (HOSPITAL_COMMUNITY): Payer: Self-pay

## 2022-12-12 NOTE — Telephone Encounter (Signed)
Levothyroxine needs PA per faxed request from [facility]

## 2022-12-12 NOTE — Telephone Encounter (Signed)
Per test claim, Pt filled a 90 day supply yesterday. No PA needed.

## 2023-01-01 ENCOUNTER — Telehealth: Payer: No Typology Code available for payment source | Admitting: Family Medicine

## 2023-01-01 DIAGNOSIS — B9689 Other specified bacterial agents as the cause of diseases classified elsewhere: Secondary | ICD-10-CM

## 2023-01-01 DIAGNOSIS — J019 Acute sinusitis, unspecified: Secondary | ICD-10-CM

## 2023-01-01 MED ORDER — DOXYCYCLINE HYCLATE 100 MG PO TABS: 100.0000 mg | ORAL_TABLET | Freq: Two times a day (BID) | ORAL | 0 refills | Status: AC

## 2023-01-01 NOTE — Progress Notes (Signed)

## 2023-01-03 ENCOUNTER — Ambulatory Visit: Payer: No Typology Code available for payment source | Admitting: Internal Medicine

## 2023-01-04 ENCOUNTER — Ambulatory Visit
Admission: RE | Admit: 2023-01-04 | Discharge: 2023-01-04 | Disposition: A | Discharge status: Home / Self Care | Payer: No Typology Code available for payment source | Source: Ambulatory Visit | Attending: Family Medicine | Admitting: Family Medicine

## 2023-01-04 VITALS — BP 136/84 | HR 86 | Temp 98.3°F | Resp 16

## 2023-01-04 DIAGNOSIS — H109 Unspecified conjunctivitis: Secondary | ICD-10-CM

## 2023-01-04 MED ORDER — MOXIFLOXACIN HCL 0.5 % OP SOLN: 1.0000 [drp] | Freq: Three times a day (TID) | OPHTHALMIC | 0 refills | Status: AC

## 2023-01-04 NOTE — Discharge Instructions (Addendum)
Instructed patient to place drops into left eye as directed.  Advised if symptoms develop in right eye may treat right eye with Vigamox as well.  Advised if symptoms worsen and/or unresolved please follow-up with optometry/ophthalmology or PCP for further evaluation.

## 2023-01-04 NOTE — ED Triage Notes (Addendum)
Reports she's had a URI over the last week. Yesterday she woke up with left eye redness, itching, burning, pain, and yellow drainage. Denies any foreign body involvement, changes to vision. Did an E-visit Tuesday for URI symptoms and was prescribed Doxycycline that she has been taking.

## 2023-01-04 NOTE — ED Provider Notes (Signed)
[facility]    CSN: [id number] Arrival date & time: 01/04/23  0801      History   Chief Complaint Chief Complaint  Patient presents with   Conjunctivitis    I've had a cold for over a week now my left eye is swollen sticking shut and yellow mucus coming out. - Entered by patient    HPI Anita Bryant is a 42 y.o. female.   HPI Pleasant 42 year old female presents with left eye redness and discharge for several days.  PMH significant for asthma, GERD and Hashimoto's disease.  Past Medical History:  Diagnosis Date   Asthma    GERD (gastroesophageal reflux disease)    Hashimoto's disease    History of nephrolithiasis     Patient Active Problem List   Diagnosis Date Noted   Discogenic lumbar pain 04/14/2022   Annual physical exam 03/03/2022   Cervical paraspinous muscle spasm 02/14/2022   Tear of peroneal tendon of right foot 02/14/2022   Hashimoto's disease 05/26/2020   Vitamin D deficiency 05/26/2020   Right ankle pain 05/25/2020   Subclinical hypothyroidism 05/10/2020   Mild intermittent asthma without complication 05/10/2020   Family history of cancer in father 05/10/2020   Asthma with acute exacerbation 11/07/2018   Deviated nasal septum 10/30/2018   Chronic rhinitis 10/30/2018   Carpal tunnel syndrome on left 10/30/2018   Family history of thyroid disease in mother 10/30/2018   History of nephrolithiasis     Past Surgical History:  Procedure Laterality Date   ANKLE SURGERY Right    CARPAL TUNNEL WITH CUBITAL TUNNEL     KIDNEY STONE SURGERY     UPPER GASTROINTESTINAL ENDOSCOPY      OB History     Gravida  0   Para  0   Term  0   Preterm  0   AB  0   Living  0      SAB  0   IAB  0   Ectopic  0   Multiple  0   Live Births  0            Home Medications    Prior to Admission medications   Medication Sig Start Date End Date Taking? Authorizing Provider  albuterol (VENTOLIN HFA) 108 (90 Base) MCG/ACT inhaler Inhale  1-2 puffs into the lungs every 4 (four) hours as needed for wheezing or shortness of breath. 03/03/22  Yes Christen Butter, NP  doxycycline (VIBRA-TABS) 100 MG tablet Take 1 tablet (100 mg total) by mouth 2 (two) times daily for 10 days. 01/01/23 01/11/23 Yes Freddy Finner, NP  esomeprazole (NEXIUM) 40 MG capsule Take 1 capsule (40 mg total) by mouth daily. 40 mg BID for 6 weeks then reduce to 40 mg daily. 01/05/22  Yes Cirigliano, Vito V, DO  levothyroxine (SYNTHROID) 25 MCG tablet TAKE 1 TABLET BY MOUTH ONCE DAILY BEFORE BREAKFAST 09/11/22  Yes Shamleffer, Konrad Dolores, MD  moxifloxacin (VIGAMOX) 0.5 % ophthalmic solution Place 1 drop into the left eye 3 (three) times daily for 7 days. 01/04/23 01/11/23 Yes Trevor Iha, FNP  acetaminophen (TYLENOL) 325 MG tablet Take 650 mg by mouth every 4 (four) hours as needed.    [provider]  benzonatate (TESSALON) 200 MG capsule Take 1 capsule (200 mg total) by mouth 3 (three) times daily as needed for cough. 07/31/22   Eustace Moore, MD  chlorpheniramine-HYDROcodone (TUSSIONEX) 10-8 MG/5ML Take 5 mLs by mouth every 12 (twelve) hours as needed for  cough. 07/31/22   Eustace MooreNelson, Yvonne Sue, MD  clobetasol cream (TEMOVATE) 0.05 % Apply 1 application topically 2 (two) times daily as needed. 05/10/20   Christen ButterJessup, Joy, NP  guaiFENesin (MUCINEX) 600 MG 12 hr tablet Take by mouth 2 (two) times daily.    [provider]  meloxicam (MOBIC) 15 MG tablet One tab PO every 24 hours with a meal for 2 weeks, then once every 24 hours prn pain. 05/10/22   Monica Bectonhekkekandam, Thomas J, MD  predniSONE (DELTASONE) 50 MG tablet Take 1 tablet (50 mg total) by mouth daily. 07/31/22   Monica Bectonhekkekandam, Thomas J, MD  Prenatal Vit-Fe Fumarate-FA (PRENATAL ONE DAILY) 27-0.8 MG TABS Take 1 tablet by mouth daily. 10/30/18   Carlis Stableummings, Charley Elizabeth, PA-C    Family History Family History  Problem Relation Age of Onset   Thyroid disease Mother    Cancer Father        MDS cancer, Swed's  disease   Colon cancer Maternal Great-grandfather    Esophageal cancer Neg Hx    Rectal cancer Neg Hx    Liver disease Neg Hx    Pancreatic cancer Neg Hx     Social History Social History   Tobacco Use   Smoking status: Never   Smokeless tobacco: Never  Vaping Use   Vaping Use: Never used  Substance Use Topics   Alcohol use: Never   Drug use: Never     Allergies   Cefdinir and Decadron [dexamethasone]   Review of Systems Review of Systems  Eyes:  Positive for discharge.  All other systems reviewed and are negative.    Physical Exam Triage Vital Signs ED Triage Vitals  Enc Vitals Group     BP      Pulse      Resp      Temp      Temp src      SpO2      Weight      Height      Head Circumference      Peak Flow      Pain Score      Pain Loc      Pain Edu?      Excl. in [place]?    No data found.  Updated Vital Signs BP 136/84 (BP Location: Right Arm)   Pulse 86   Temp 98.3 F (36.8 C) (Oral)   Resp 16   SpO2 100%      Physical Exam Vitals and nursing note reviewed.  Constitutional:      Appearance: Normal appearance. She is obese.  HENT:     Head: Normocephalic and atraumatic.     Mouth/Throat:     Mouth: Mucous membranes are moist.     Pharynx: Oropharynx is clear.  Eyes:     Extraocular Movements: Extraocular movements intact.     Conjunctiva/sclera: Conjunctivae normal.     Pupils: Pupils are equal, round, and reactive to light.     Comments: LEFT eye: Sclera with +3 injection, mucopurulent discharge noticed at inner canthus, with mild swelling of upper eyelid  Cardiovascular:     Rate and Rhythm: Normal rate and regular rhythm.     Pulses: Normal pulses.     Heart sounds: Normal heart sounds.  Pulmonary:     Effort: Pulmonary effort is normal.     Breath sounds: Normal breath sounds. No wheezing, rhonchi or rales.  Musculoskeletal:        General: Normal range of motion.  Cervical back: Normal range of motion and neck supple.   Skin:    General: Skin is warm and dry.  Neurological:     General: No focal deficit present.     Mental Status: She is alert and oriented to person, place, and time. Mental status is at baseline.      UC Treatments / Results  Labs (all labs ordered are listed, but only abnormal results are displayed) Labs Reviewed - No data to display  EKG   Radiology No results found.  Procedures Procedures (including critical care time)  Medications Ordered in UC Medications - No data to display  Initial Impression / Assessment and Plan / UC Course  I have reviewed the triage vital signs and the nursing notes.  Pertinent labs & imaging results that were available during my care of the patient were reviewed by me and considered in my medical decision making (see chart for details).     MDM: 1.  Conjunctivitis of left eye, unspecified conjunctivitis type-Rx'd Vigamox 0.5% Ophthalmic Solution-Place 1 drop into left eye 3 times daily x 7 days. Instructed patient to place drops into left eye as directed.  Advised if symptoms develop in right eye may treat right eye with Vigamox as well.  Advised if symptoms worsen and/or unresolved please follow-up with optometry/ophthalmology or PCP for further evaluation.  Patient discharged home, hemodynamically stable. Final Clinical Impressions(s) / UC Diagnoses   Final diagnoses:  Conjunctivitis of left eye, unspecified conjunctivitis type     Discharge Instructions      Instructed patient to place drops into left eye as directed.  Advised if symptoms develop in right eye may treat right eye with Vigamox as well.  Advised if symptoms worsen and/or unresolved please follow-up with optometry/ophthalmology or PCP for further evaluation.     ED Prescriptions     Medication Sig Dispense Auth. Provider   moxifloxacin (VIGAMOX) 0.5 % ophthalmic solution Place 1 drop into the left eye 3 (three) times daily for 7 days. 3 mL Trevor Iha, FNP       PDMP not reviewed this encounter.   Trevor Iha, FNP 01/04/23 [phone]

## 2023-01-08 ENCOUNTER — Encounter: Payer: Self-pay | Admitting: Internal Medicine

## 2023-01-08 ENCOUNTER — Ambulatory Visit (INDEPENDENT_AMBULATORY_CARE_PROVIDER_SITE_OTHER): Payer: No Typology Code available for payment source | Admitting: Internal Medicine

## 2023-01-08 VITALS — BP 112/68 | HR 62 | Ht 61.0 in | Wt 174.0 lb

## 2023-01-08 DIAGNOSIS — E063 Autoimmune thyroiditis: Secondary | ICD-10-CM | POA: Diagnosis not present

## 2023-01-08 NOTE — Progress Notes (Unsigned)
Name: Anita Bryant  MRN/ DOB: [id number], 10-18-1980    Age/ Sex: 42 y.o., female     PCP: Christen Butter, NP   Reason for Endocrinology Evaluation: Subclinical hypothyroidism     Initial Endocrinology Clinic Visit: 05/25/2020    PATIENT IDENTIFIER: Anita Bryant is a 42 y.o., female with a past medical history of Asthma. She has followed with [facility] since 05/25/2020 for consultative assistance with management of her subclinical hypothyroidism  HISTORICAL SUMMARY:   Pt has been noted to have an intermittent elevated in TSH since 2011 with a max level of 6.0 uIU/mL in 04/2020 with normal FT4 at 1.3 ng/dL and normal TT3 at 409 ng/dL.  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   Weight has been fluctuating  Denies local neck swelling  Continues with hair loss  Denies palpitations except on rare occasion  Denies tremors  No Biotin   She has been diagnosed with hiatal hernia, she has GERD ,  she is on PPI permanently, S/P esophageal dilatation    Levothyroxine 25 mcg daily     HISTORY:  Past Medical History:  Past Medical History:  Diagnosis Date   Asthma    GERD (gastroesophageal reflux disease)    Hashimoto's disease    History of nephrolithiasis    Past Surgical History:  Past Surgical History:  Procedure Laterality Date   ANKLE SURGERY Right    CARPAL TUNNEL WITH CUBITAL TUNNEL     KIDNEY STONE SURGERY     UPPER GASTROINTESTINAL ENDOSCOPY     Social History:  reports that she has never smoked. She has never used smokeless tobacco. She reports that she does not drink alcohol and does not use drugs. Family History:  Family History  Problem Relation Age of Onset    Thyroid disease Mother    Cancer Father        MDS cancer, Swed's disease   Colon cancer Maternal Great-grandfather    Esophageal cancer Neg Hx    Rectal cancer Neg Hx    Liver disease Neg Hx    Pancreatic cancer Neg Hx      HOME MEDICATIONS: Allergies as of 01/08/2023       Reactions   Cefdinir Nausea And Vomiting   Decadron [dexamethasone] Nausea And Vomiting        Medication List        Accurate as of January 08, 2023 11:21 AM. If you have any questions, ask your nurse or doctor.          acetaminophen 325 MG tablet Commonly known as: TYLENOL Take 650 mg by mouth every 4 (four) hours as needed.   albuterol 108 (90 Base) MCG/ACT inhaler Commonly known as: VENTOLIN HFA Inhale 1-2 puffs into the lungs every 4 (four) hours as needed for wheezing or shortness of breath.   benzonatate 200 MG capsule Commonly known as: TESSALON Take 1 capsule (200 mg total) by mouth 3 (three) times daily as needed for cough.   chlorpheniramine-HYDROcodone 10-8 MG/5ML Commonly known as: TUSSIONEX Take 5 mLs by mouth every 12 (twelve) hours as needed for cough.   clobetasol cream 0.05 % Commonly known as: TEMOVATE  Apply 1 application topically 2 (two) times daily as needed.   doxycycline 100 MG tablet Commonly known as: VIBRA-TABS Take 1 tablet (100 mg total) by mouth 2 (two) times daily for 10 days.   esomeprazole 40 MG capsule Commonly known as: NexIUM Take 1 capsule (40 mg total) by mouth daily. 40 mg BID for 6 weeks then reduce to 40 mg daily.   levothyroxine 25 MCG tablet Commonly known as: SYNTHROID TAKE 1 TABLET BY MOUTH ONCE DAILY BEFORE BREAKFAST   meloxicam 15 MG tablet Commonly known as: MOBIC One tab PO every 24 hours with a meal for 2 weeks, then once every 24 hours prn pain.   moxifloxacin 0.5 % ophthalmic solution Commonly known as: Vigamox Place 1 drop into the left eye 3 (three) times daily for 7 days.   Mucinex 600 MG 12 hr tablet Generic drug:  guaiFENesin Take by mouth 2 (two) times daily.   predniSONE 50 MG tablet Commonly known as: DELTASONE Take 1 tablet (50 mg total) by mouth daily.   Prenatal One Daily 27-0.8 MG Tabs Take 1 tablet by mouth daily.          OBJECTIVE:   PHYSICAL EXAM: VS: There were no vitals taken for this visit.   EXAM: General: Pt appears well and is in NAD  Neck: General: Supple without adenopathy. Thyroid: Thyroid size normal.  No goiter or nodules appreciated.  Lungs: Clear with good BS bilat with no rales, rhonchi, or wheezes  Heart: Auscultation: RRR.  Abdomen: Normoactive bowel sounds, soft, nontender, without masses or organomegaly palpable  Extremities:  BL LE: No pretibial edema normal ROM and strength.  Mental Status: Judgment, insight: Intact Orientation: Oriented to time, place, and person Mood and affect: No depression, anxiety, or agitation     DATA REVIEWED:  Latest Reference Range & Units 01/02/22 14:54  TSH 0.35 - 5.50 uIU/mL 5.86 (H)  T4,Free(Direct) 0.60 - 1.60 ng/dL 2.80    Results for COLINE, BUREK (MRN [id number]) as of 08/24/2020 08:30  Ref. Range 05/25/2020 10:57  Thyroperoxidase Ab SerPl-aCnc Latest Ref Range: <9 IU/mL 20 (H)    ASSESSMENT / PLAN / RECOMMENDATIONS:     Hashimoto's Thyroiditis    - Pt with symptoms of hair loss - Pt educated extensively on the correct way to take levothyroxine (first thing in the morning with water, 30 minutes before eating or taking other medications). - Pt encouraged to double dose the following day if she were to miss a dose given long half-life of levothyroxine.  Medication  levothyroxine 25 mcg daily   2. Hair loss :  - Pt may started Biotin but she was advised to hold it 2-3 days  before any future thyroid lab work   F/U in 1 yr    Labs in 2 months  Signed electronically by: Lyndle Herrlich, MD  [facility]  [facility] [address] [place], [place]  [address] Phone: [phone] FAX: [phone]      CC: Christen Butter, NP [address] [place] [address] Phone: [phone]  Fax: [phone]   Return to Endocrinology clinic as below: Future Appointments  Date Time Provider Department Center  01/08/2023  2:40 PM Catia Todorov, Konrad Dolores, MD [id number] None

## 2023-01-08 NOTE — Patient Instructions (Signed)

## 2023-01-09 ENCOUNTER — Ambulatory Visit: Payer: No Typology Code available for payment source | Admitting: Internal Medicine

## 2023-01-09 LAB — TSH: TSH: 3.68 u[IU]/mL (ref 0.35–5.50)

## 2023-01-09 LAB — T4, FREE: Free T4: 1.02 ng/dL (ref 0.60–1.60)

## 2023-01-09 MED ORDER — LEVOTHYROXINE SODIUM 50 MCG PO TABS: 50.0000 ug | ORAL_TABLET | Freq: Every day | ORAL | 3 refills | Status: DC

## 2023-01-10 ENCOUNTER — Other Ambulatory Visit: Payer: Self-pay | Admitting: Medical-Surgical

## 2023-01-11 ENCOUNTER — Encounter: Payer: Self-pay | Admitting: Medical-Surgical

## 2023-01-11 MED ORDER — ALBUTEROL SULFATE HFA 108 (90 BASE) MCG/ACT IN AERS: 1.0000 | INHALATION_SPRAY | RESPIRATORY_TRACT | 0 refills | Status: AC | PRN

## 2023-02-01 ENCOUNTER — Encounter: Payer: No Typology Code available for payment source | Admitting: Medical-Surgical

## 2023-02-16 ENCOUNTER — Encounter: Payer: Self-pay | Admitting: Medical-Surgical

## 2023-02-16 ENCOUNTER — Ambulatory Visit (INDEPENDENT_AMBULATORY_CARE_PROVIDER_SITE_OTHER): Payer: No Typology Code available for payment source | Admitting: Medical-Surgical

## 2023-02-16 VITALS — BP 128/81 | HR 57 | Resp 20 | Ht 61.0 in | Wt 172.1 lb

## 2023-02-16 DIAGNOSIS — Z13220 Encounter for screening for lipoid disorders: Secondary | ICD-10-CM | POA: Diagnosis not present

## 2023-02-16 DIAGNOSIS — Z131 Encounter for screening for diabetes mellitus: Secondary | ICD-10-CM

## 2023-02-16 DIAGNOSIS — Z0000 Encounter for general adult medical examination without abnormal findings: Secondary | ICD-10-CM | POA: Diagnosis not present

## 2023-02-16 DIAGNOSIS — Z1231 Encounter for screening mammogram for malignant neoplasm of breast: Secondary | ICD-10-CM | POA: Diagnosis not present

## 2023-02-16 MED ORDER — CYCLOBENZAPRINE HCL 10 MG PO TABS: 5.0000 mg | ORAL_TABLET | Freq: Three times a day (TID) | ORAL | 1 refills | Status: AC | PRN

## 2023-02-16 NOTE — Progress Notes (Signed)
Complete physical exam  Patient: Anita Bryant   DOB: Feb 07, 1981   41 y.o. Female  MRN: [id number]  Subjective:    Chief Complaint  Patient presents with   Annual Exam   Anita Bryant is a 42 y.o. female who presents today for a complete physical exam. She reports consuming a general diet.  Daily exercise with walking and strength training.  She generally feels well. She reports sleeping well. She does not have additional problems to discuss today.    Most recent fall risk assessment:    03/03/2022    9:57 AM  Fall Risk   Falls in the past year? 0  Number falls in past yr: 0  Injury with Fall? 0  Risk for fall due to : No Fall Risks  Follow up Falls evaluation completed     Most recent depression screenings:    02/16/2023    8:35 AM 03/03/2022    9:57 AM  PHQ 2/9 Scores  PHQ - 2 Score 0 0    Vision:Within last year, Dental: No current dental problems and Receives regular dental care, and STD: The patient denies history of sexually transmitted disease.    Patient Care Team: Christen Butter, NP as PCP - General (Nurse Practitioner)   Outpatient Medications Prior to Visit  Medication Sig   acetaminophen (TYLENOL) 325 MG tablet Take 650 mg by mouth every 4 (four) hours as needed.   albuterol (VENTOLIN HFA) 108 (90 Base) MCG/ACT inhaler Inhale 1-2 puffs into the lungs every 4 (four) hours as needed for wheezing or shortness of breath.   clobetasol cream (TEMOVATE) 0.05 % Apply 1 application topically 2 (two) times daily as needed.   esomeprazole (NEXIUM) 40 MG capsule Take 1 capsule (40 mg total) by mouth daily. 40 mg BID for 6 weeks then reduce to 40 mg daily.   levothyroxine (SYNTHROID) 50 MCG tablet Take 1 tablet (50 mcg total) by mouth daily.   meloxicam (MOBIC) 15 MG tablet One tab PO every 24 hours with a meal for 2 weeks, then once every 24 hours prn pain.   Prenatal Vit-Fe Fumarate-FA (PRENATAL ONE DAILY) 27-0.8 MG TABS Take 1 tablet by mouth daily.   No  facility-administered medications prior to visit.   Review of Systems  Constitutional:  Negative for chills, fever, malaise/fatigue and weight loss.  HENT:  Negative for congestion, ear pain, hearing loss, sinus pain and sore throat.   Eyes:  Negative for blurred vision, photophobia and pain.  Respiratory:  Negative for cough, shortness of breath and wheezing.   Cardiovascular:  Negative for chest pain, palpitations and leg swelling.  Gastrointestinal:  Negative for abdominal pain, constipation, diarrhea, heartburn, nausea and vomiting.  Genitourinary:  Negative for dysuria, frequency and urgency.  Musculoskeletal:  Positive for back pain. Negative for falls and neck pain.  Skin:  Negative for itching and rash.  Neurological:  Negative for dizziness, weakness and headaches.  Endo/Heme/Allergies:  Negative for polydipsia. Does not bruise/bleed easily.  Psychiatric/Behavioral:  Negative for depression, substance abuse and suicidal ideas. The patient is not nervous/anxious.      Objective:  -  BP 128/81 (BP Location: Right Arm, Cuff Size: Normal)   Pulse (!) 57   Resp 20   Ht 5\' 1"  (1.549 m)   Wt 172 lb 1.6 oz (78.1 kg)   SpO2 98%   BMI 32.52 kg/m    Physical Exam Vitals reviewed.  Constitutional:      General: She is not in  acute distress.    Appearance: Normal appearance. She is not ill-appearing.  HENT:     Head: Normocephalic and atraumatic.     Right Ear: Tympanic membrane, ear canal and external ear normal. There is no impacted cerumen.     Left Ear: Tympanic membrane, ear canal and external ear normal. There is no impacted cerumen.     Nose: Nose normal. No congestion or rhinorrhea.     Mouth/Throat:     Mouth: Mucous membranes are moist.     Pharynx: No oropharyngeal exudate or posterior oropharyngeal erythema.  Eyes:     General: No scleral icterus.       Right eye: No discharge.        Left eye: No discharge.     Extraocular Movements: Extraocular movements intact.      Conjunctiva/sclera: Conjunctivae normal.     Pupils: Pupils are equal, round, and reactive to light.  Neck:     Thyroid: No thyromegaly.     Vascular: No carotid bruit or JVD.     Trachea: Trachea normal.  Cardiovascular:     Rate and Rhythm: Normal rate and regular rhythm.     Pulses: Normal pulses.     Heart sounds: Normal heart sounds. No murmur heard.    No friction rub. No gallop.  Pulmonary:     Effort: Pulmonary effort is normal. No respiratory distress.     Breath sounds: Normal breath sounds. No wheezing.  Abdominal:     General: Bowel sounds are normal. There is no distension.     Palpations: Abdomen is soft.     Tenderness: There is no abdominal tenderness. There is no guarding.  Musculoskeletal:        General: Normal range of motion.     Cervical back: Normal range of motion and neck supple.  Lymphadenopathy:     Cervical: No cervical adenopathy.  Skin:    General: Skin is warm and dry.  Neurological:     Mental Status: She is alert and oriented to person, place, and time.     Cranial Nerves: No cranial nerve deficit.  Psychiatric:        Mood and Affect: Mood normal.        Behavior: Behavior normal.        Thought Content: Thought content normal.        Judgment: Judgment normal.      No results found for any visits on 02/16/23.     Assessment & Plan:    Routine Health Maintenance and Physical Exam  Immunization History  Administered Date(s) Administered   Influenza,inj,Quad PF,6+ Mos 06/13/2019, 07/05/2020, 07/12/2021, 06/19/2022   PFIZER(Purple Top)SARS-COV-2 Vaccination 11/29/2019, 12/20/2019, 11/10/2020    Health Maintenance  Topic Date Due   DTaP/Tdap/Td (1 - Tdap) Never done   HIV Screening  02/16/2024 (Originally 04/01/1997)   INFLUENZA VACCINE  04/26/2023   PAP SMEAR-Modifier  11/29/2024   HPV VACCINES  Aged Out   COVID-19 Vaccine  Discontinued   Hepatitis C Screening  Discontinued    Discussed health benefits of physical activity,  and encouraged her to engage in regular exercise appropriate for her age and condition.  1. Annual physical exam Checking labs as below.  Up-to-date on preventative care.  Wellness information provided with AVS. - Lipid panel - COMPLETE METABOLIC PANEL WITH GFR - CBC with Differential/Platelet  2. Diabetes mellitus screening Checking hemoglobin A1c. - Hemoglobin A1c  3. Lipid screening Checking lipid panel today. - Lipid panel  Return  in about 1 year (around 02/16/2024) for annual physical exam.   Christen Butter, NP

## 2023-02-17 LAB — COMPLETE METABOLIC PANEL WITH GFR
AG Ratio: 1.6 (calc) (ref 1.0–2.5)
ALT: 13 U/L (ref 6–29)
AST: 13 U/L (ref 10–30)
Albumin: 4.6 g/dL (ref 3.6–5.1)
Alkaline phosphatase (APISO): 84 U/L (ref 31–125)
BUN: 20 mg/dL (ref 7–25)
CO2: 21 mmol/L (ref 20–32)
Calcium: 9.9 mg/dL (ref 8.6–10.2)
Chloride: 108 mmol/L (ref 98–110)
Creat: 0.9 mg/dL (ref 0.50–0.99)
Globulin: 2.8 g/dL (calc) (ref 1.9–3.7)
Glucose, Bld: 93 mg/dL (ref 65–99)
Potassium: 4.3 mmol/L (ref 3.5–5.3)
Sodium: 141 mmol/L (ref 135–146)
Total Bilirubin: 0.5 mg/dL (ref 0.2–1.2)
Total Protein: 7.4 g/dL (ref 6.1–8.1)
eGFR: 83 mL/min/{1.73_m2} (ref >=60)

## 2023-02-17 LAB — CBC WITH DIFFERENTIAL/PLATELET
Absolute Monocytes: 504 cells/uL (ref 200–950)
Basophils Absolute: 42 cells/uL (ref 0–200)
Basophils Relative: 0.6 %
Eosinophils Absolute: 112 cells/uL (ref 15–500)
Eosinophils Relative: 1.6 %
HCT: 38 % (ref 35.0–45.0)
Hemoglobin: 12.3 g/dL (ref 11.7–15.5)
Lymphs Abs: 1393 cells/uL (ref 850–3900)
MCH: 26.1 pg — ABNORMAL LOW (ref 27.0–33.0)
MCHC: 32.4 g/dL (ref 32.0–36.0)
MCV: 80.7 fL (ref 80.0–100.0)
MPV: 9.3 fL (ref 7.5–12.5)
Monocytes Relative: 7.2 %
Neutro Abs: 4949 cells/uL (ref 1500–7800)
Neutrophils Relative %: 70.7 %
Platelets: 414 10*3/uL — ABNORMAL HIGH (ref 140–400)
RBC: 4.71 10*6/uL (ref 3.80–5.10)
RDW: 14 % (ref 11.0–15.0)
Total Lymphocyte: 19.9 %
WBC: 7 10*3/uL (ref 3.8–10.8)

## 2023-02-17 LAB — HEMOGLOBIN A1C
Hgb A1c MFr Bld: 5.6 % of total Hgb (ref <5.7)
Mean Plasma Glucose: 114 mg/dL
eAG (mmol/L): 6.3 mmol/L

## 2023-02-17 LAB — LIPID PANEL
Cholesterol: 192 mg/dL (ref <200)
HDL: 49 mg/dL — ABNORMAL LOW (ref >=50)
LDL Cholesterol (Calc): 119 mg/dL (calc) — ABNORMAL HIGH
Non-HDL Cholesterol (Calc): 143 mg/dL (calc) — ABNORMAL HIGH (ref <130)
Total CHOL/HDL Ratio: 3.9 (calc) (ref <5.0)
Triglycerides: 126 mg/dL (ref <150)

## 2023-02-22 ENCOUNTER — Encounter: Payer: Self-pay | Admitting: Medical-Surgical

## 2023-03-08 ENCOUNTER — Other Ambulatory Visit: Payer: Self-pay | Admitting: Internal Medicine

## 2023-03-09 ENCOUNTER — Telehealth: Payer: Self-pay | Admitting: Gastroenterology

## 2023-03-09 ENCOUNTER — Other Ambulatory Visit: Payer: Self-pay | Admitting: Gastroenterology

## 2023-03-09 NOTE — Telephone Encounter (Signed)
Nexium sent to patient's pharmacy. Appointment scheduled with patient on 06/15/23 at 3:00 PM with Dr. Barron Alvine.

## 2023-03-09 NOTE — Telephone Encounter (Signed)
Inbound call from patient stating she needs a refill for Nexium. Please advise.

## 2023-04-10 IMAGING — CT CT ABD-PELV W/O CM
2 of 4 series · 16 of 46 positions shown, 18 images · non-contrast
Comparison: Renal ultrasound dated January 18, 2021. CT abdomen
pelvis dated December 18, 2018.

CLINICAL DATA: Right flank and abdominal pain.

EXAM:
CT ABDOMEN AND PELVIS WITHOUT CONTRAST
TECHNIQUE: Multidetector CT imaging of the abdomen and pelvis was performed
following the standard protocol without IV contrast.

[Series 2: axial st · axial · 0.83mm/px · z∈[-486,-81]mm · 13 of 89 slices shown, 15 images]
[im 4/89  soft-tissue]
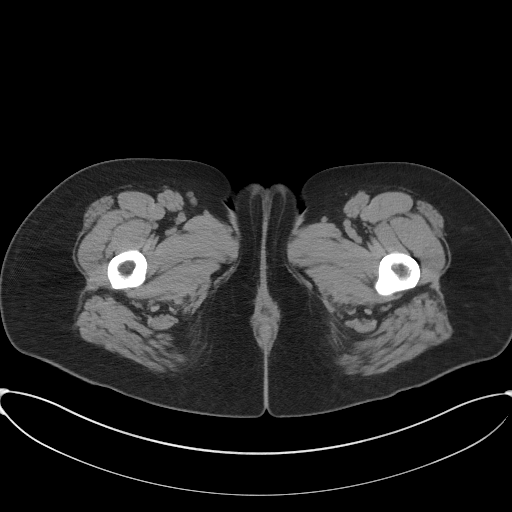
[im 4/89  bone]
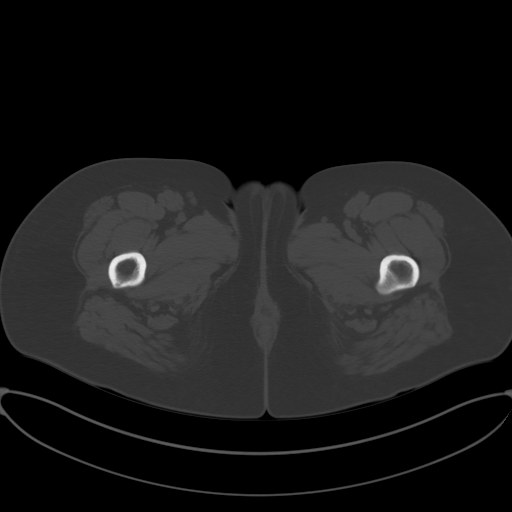
[im 11/89  soft-tissue]
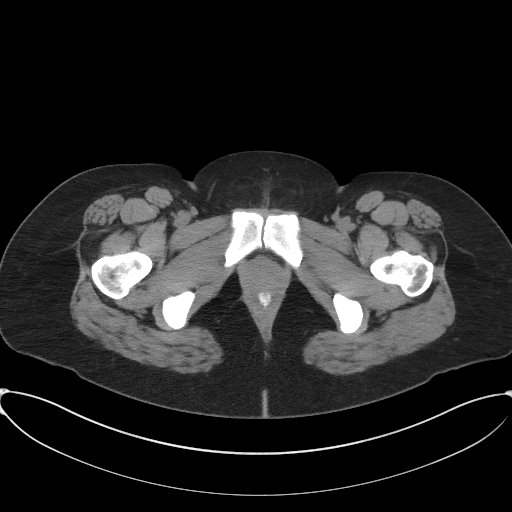
[im 17/89  soft-tissue]
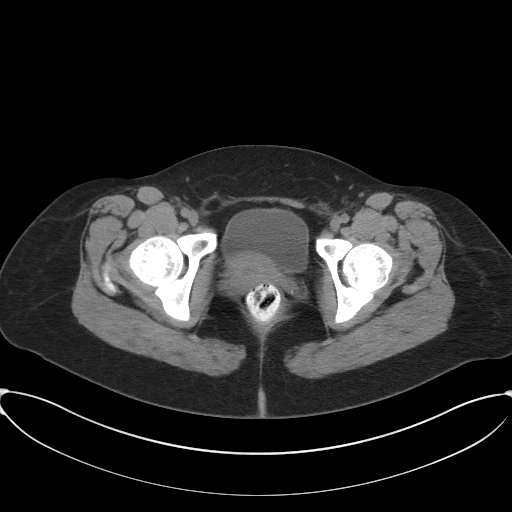
[im 24/89  soft-tissue]
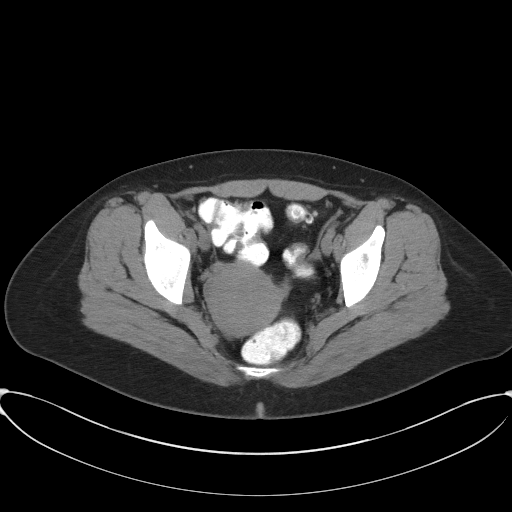
[im 31/89  soft-tissue]
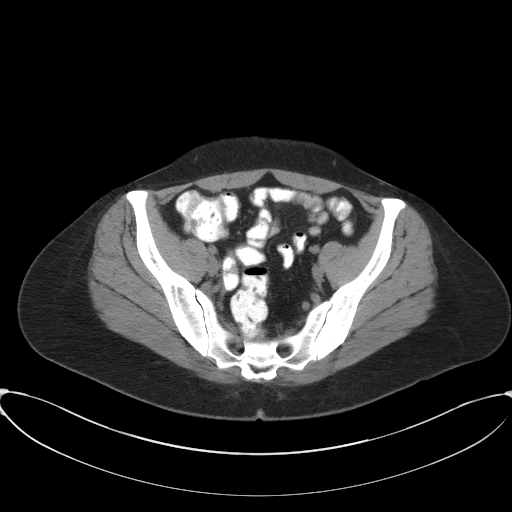
[im 38/89  soft-tissue]
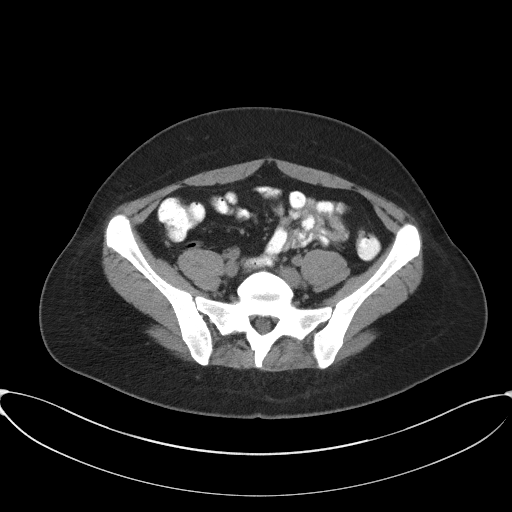
[im 45/89  soft-tissue]
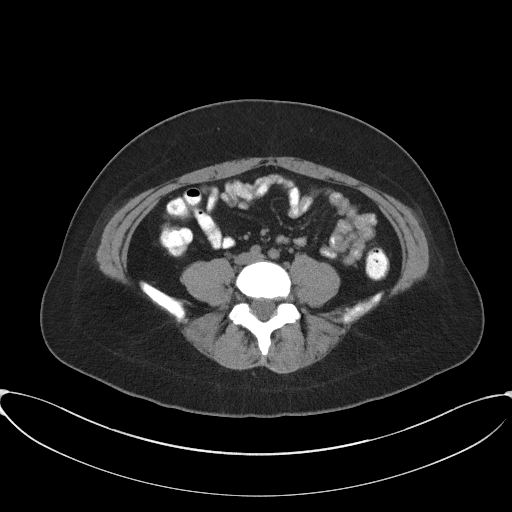
[im 51/89  soft-tissue]
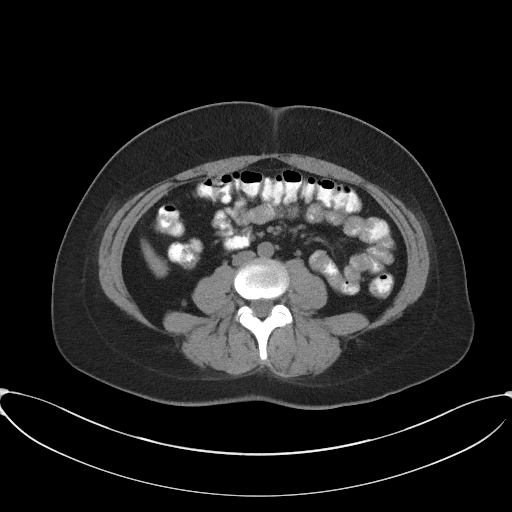
[im 58/89  soft-tissue]
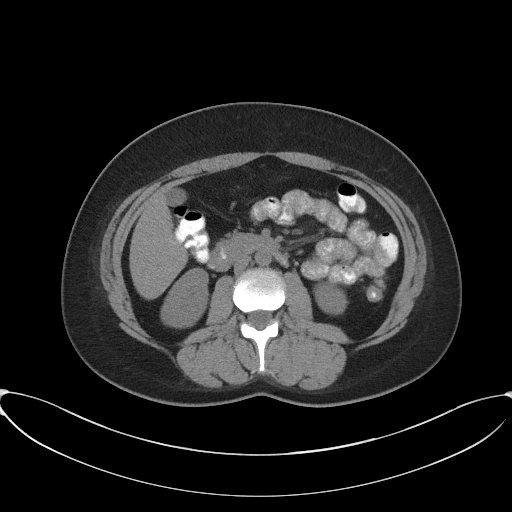
[im 58/89  bone]
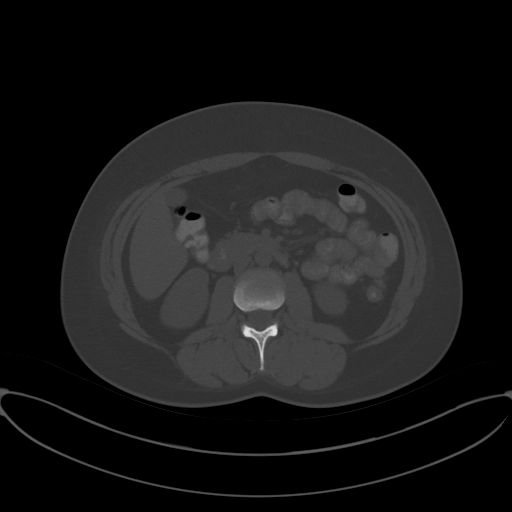
[im 65/89  soft-tissue]
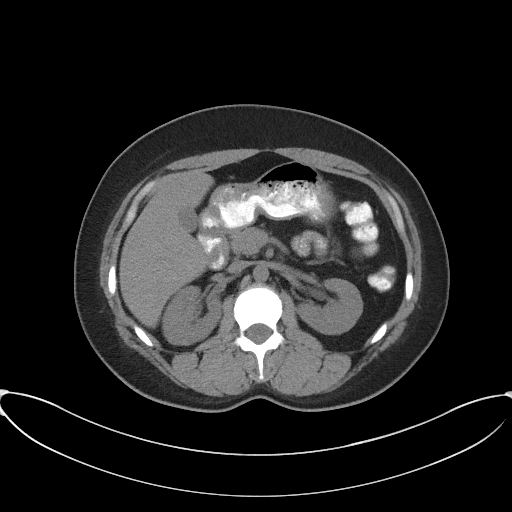
[im 72/89  soft-tissue]
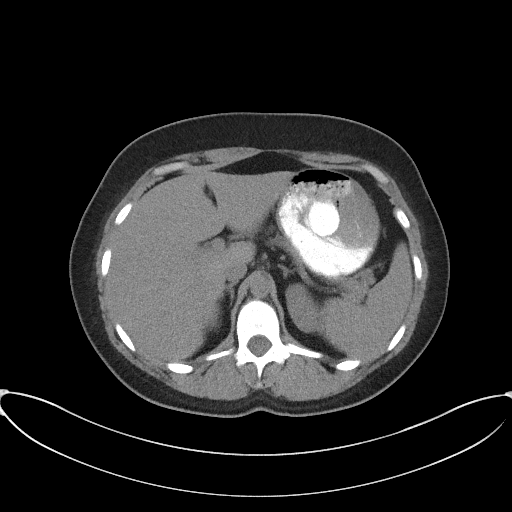
[im 78/89  soft-tissue]
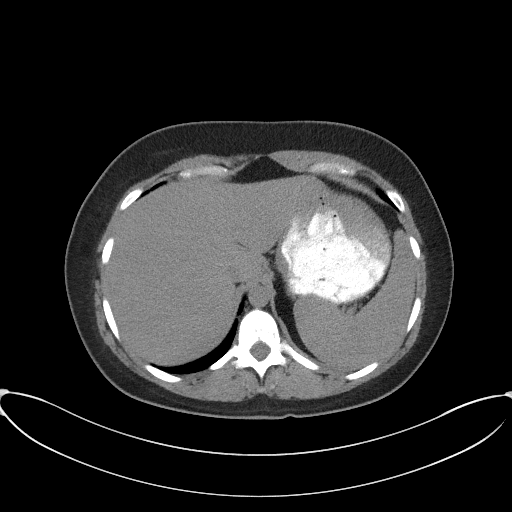
[im 85/89  soft-tissue]
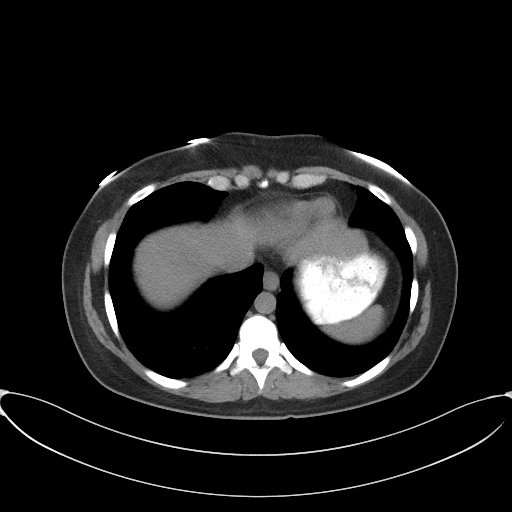

[Series 5: coronal st · coronal · 0.76mm/px · 3 of 101 slices shown]
[im 34/101  soft-tissue]
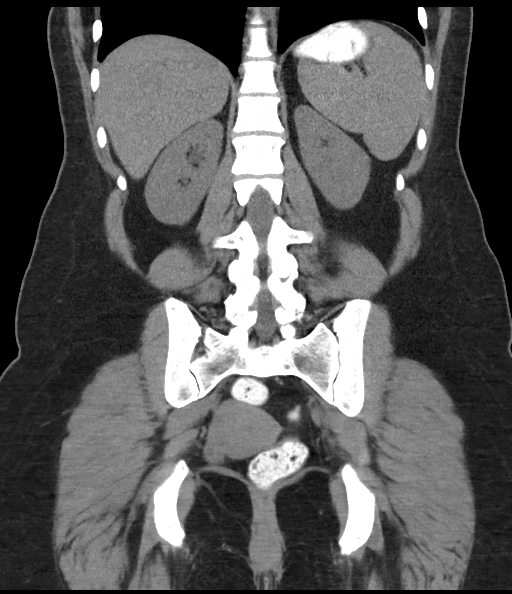
[im 45/101  soft-tissue]
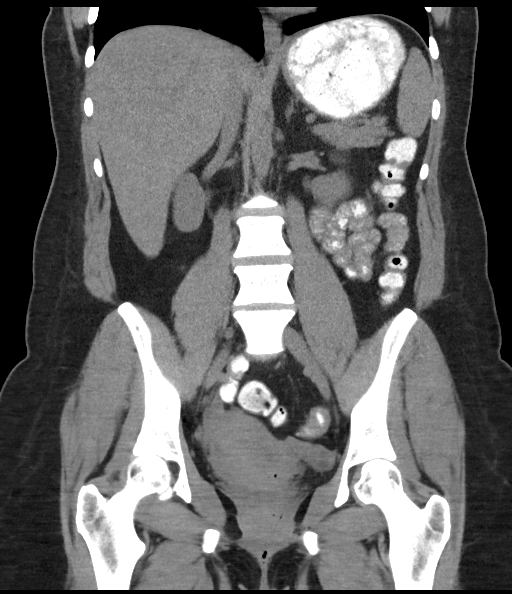
[im 56/101  soft-tissue]
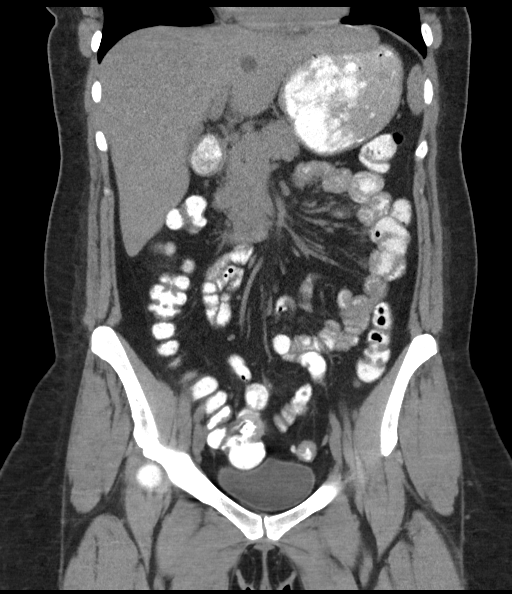

[16 of 46 positions shown; findings below may reference images not displayed]

FINDINGS: Lower chest: No acute abnormality.

Hepatobiliary: Unchanged 1.5 cm simple cyst in the left hepatic
lobe. The gallbladder is unremarkable. No biliary dilatation.

Pancreas: Unremarkable. No pancreatic ductal dilatation or
surrounding inflammatory changes.

Spleen: Normal in size without focal abnormality.

Adrenals/Urinary Tract: Adrenal glands are unremarkable. Kidneys are
normal, without renal calculi, focal lesion, or hydronephrosis.
Bladder is unremarkable.

Stomach/Bowel: Stomach is within normal limits. Appendix appears
normal. No evidence of bowel wall thickening, distention, or
inflammatory changes. Mild sigmoid colonic diverticulosis.

Vascular/Lymphatic: No significant vascular findings are present. No
enlarged abdominal or pelvic lymph nodes.

Reproductive: Uterus and bilateral adnexa are unremarkable.

Other: No abdominal wall hernia or abnormality. No abdominopelvic
ascites. No pneumoperitoneum.

Musculoskeletal: No acute or significant osseous findings.
IMPRESSION: 1. No acute intra-abdominal process. No urolithiasis.

## 2023-06-15 ENCOUNTER — Ambulatory Visit: Payer: No Typology Code available for payment source | Admitting: Gastroenterology

## 2023-06-29 ENCOUNTER — Encounter: Payer: Self-pay | Admitting: Gastroenterology

## 2023-07-06 ENCOUNTER — Ambulatory Visit: Payer: No Typology Code available for payment source | Admitting: Gastroenterology

## 2023-12-14 ENCOUNTER — Other Ambulatory Visit: Payer: Self-pay | Admitting: Internal Medicine

## 2024-01-14 ENCOUNTER — Ambulatory Visit: Payer: No Typology Code available for payment source | Admitting: Internal Medicine

## 2024-03-08 ENCOUNTER — Other Ambulatory Visit: Payer: Self-pay | Admitting: Internal Medicine

## 2024-05-27 ENCOUNTER — Encounter: Payer: Self-pay | Admitting: Sports Medicine

## 2024-08-25 ENCOUNTER — Other Ambulatory Visit: Payer: Self-pay | Admitting: Internal Medicine

## 2024-09-13 ENCOUNTER — Other Ambulatory Visit: Payer: Self-pay | Admitting: Internal Medicine

## 2024-09-16 NOTE — Telephone Encounter (Signed)
 [id number],[id number] to schedule follow up with Dr. Sam.

## 2024-09-23 ENCOUNTER — Other Ambulatory Visit: Payer: Self-pay

## 2024-09-23 NOTE — Telephone Encounter (Signed)
 Patient states that she has moved back home to [place]  and has another endocrinologist there.
